# Patient Record
Sex: Male | Born: 1950
Health system: Southern US, Community
[De-identification: ages and names within clinical notes are randomized; demographics above are authoritative.]

## PROBLEM LIST (undated history)

## (undated) DIAGNOSIS — I10 Essential (primary) hypertension: Secondary | ICD-10-CM

## (undated) DIAGNOSIS — N200 Calculus of kidney: Secondary | ICD-10-CM

## (undated) DIAGNOSIS — J302 Other seasonal allergic rhinitis: Secondary | ICD-10-CM

## (undated) DIAGNOSIS — K219 Gastro-esophageal reflux disease without esophagitis: Secondary | ICD-10-CM

## (undated) DIAGNOSIS — R0609 Other forms of dyspnea: Secondary | ICD-10-CM

## (undated) DIAGNOSIS — R519 Headache, unspecified: Secondary | ICD-10-CM

## (undated) DIAGNOSIS — R0600 Dyspnea, unspecified: Secondary | ICD-10-CM

## (undated) DIAGNOSIS — M1990 Unspecified osteoarthritis, unspecified site: Secondary | ICD-10-CM

## (undated) DIAGNOSIS — G4733 Obstructive sleep apnea (adult) (pediatric): Secondary | ICD-10-CM

## (undated) DIAGNOSIS — K589 Irritable bowel syndrome without diarrhea: Secondary | ICD-10-CM

## (undated) HISTORY — DX: Other forms of dyspnea: R06.09

## (undated) HISTORY — DX: Other seasonal allergic rhinitis: J30.2

## (undated) HISTORY — PX: HAND SURGERY: SHX662

## (undated) HISTORY — DX: Essential (primary) hypertension: I10

## (undated) HISTORY — DX: Irritable bowel syndrome, unspecified: K58.9

## (undated) HISTORY — PX: NECK SURGERY: SHX720

## (undated) HISTORY — DX: Dyspnea, unspecified: R06.00

## (undated) HISTORY — DX: Unspecified osteoarthritis, unspecified site: M19.90

## (undated) HISTORY — PX: SHOULDER SURGERY: SHX246

## (undated) HISTORY — DX: Headache, unspecified: R51.9

## (undated) HISTORY — DX: Gastro-esophageal reflux disease without esophagitis: K21.9

## (undated) HISTORY — DX: Obstructive sleep apnea (adult) (pediatric): G47.33

## (undated) HISTORY — DX: Calculus of kidney: N20.0

---

## 2017-10-11 DIAGNOSIS — Z1331 Encounter for screening for depression: Secondary | ICD-10-CM | POA: Diagnosis not present

## 2017-10-11 DIAGNOSIS — Z6832 Body mass index (BMI) 32.0-32.9, adult: Secondary | ICD-10-CM | POA: Diagnosis not present

## 2017-10-11 DIAGNOSIS — Z9181 History of falling: Secondary | ICD-10-CM | POA: Diagnosis not present

## 2017-10-11 DIAGNOSIS — R2689 Other abnormalities of gait and mobility: Secondary | ICD-10-CM | POA: Diagnosis not present

## 2017-10-11 DIAGNOSIS — E559 Vitamin D deficiency, unspecified: Secondary | ICD-10-CM | POA: Diagnosis not present

## 2017-10-11 DIAGNOSIS — I10 Essential (primary) hypertension: Secondary | ICD-10-CM | POA: Diagnosis not present

## 2017-10-11 DIAGNOSIS — Z79899 Other long term (current) drug therapy: Secondary | ICD-10-CM | POA: Diagnosis not present

## 2017-10-11 DIAGNOSIS — F419 Anxiety disorder, unspecified: Secondary | ICD-10-CM | POA: Diagnosis not present

## 2017-12-08 DIAGNOSIS — Z1339 Encounter for screening examination for other mental health and behavioral disorders: Secondary | ICD-10-CM | POA: Diagnosis not present

## 2017-12-08 DIAGNOSIS — H8149 Vertigo of central origin, unspecified ear: Secondary | ICD-10-CM | POA: Diagnosis not present

## 2017-12-08 DIAGNOSIS — Z6833 Body mass index (BMI) 33.0-33.9, adult: Secondary | ICD-10-CM | POA: Diagnosis not present

## 2018-01-23 DIAGNOSIS — R42 Dizziness and giddiness: Secondary | ICD-10-CM | POA: Diagnosis not present

## 2018-01-23 DIAGNOSIS — G9389 Other specified disorders of brain: Secondary | ICD-10-CM | POA: Diagnosis not present

## 2018-01-30 DIAGNOSIS — Z0000 Encounter for general adult medical examination without abnormal findings: Secondary | ICD-10-CM | POA: Diagnosis not present

## 2018-01-30 DIAGNOSIS — I10 Essential (primary) hypertension: Secondary | ICD-10-CM | POA: Diagnosis not present

## 2018-01-30 DIAGNOSIS — K219 Gastro-esophageal reflux disease without esophagitis: Secondary | ICD-10-CM | POA: Diagnosis not present

## 2018-01-30 DIAGNOSIS — F419 Anxiety disorder, unspecified: Secondary | ICD-10-CM | POA: Diagnosis not present

## 2018-01-30 DIAGNOSIS — M2550 Pain in unspecified joint: Secondary | ICD-10-CM | POA: Diagnosis not present

## 2018-02-02 DIAGNOSIS — G9389 Other specified disorders of brain: Secondary | ICD-10-CM | POA: Diagnosis not present

## 2018-02-02 DIAGNOSIS — R4189 Other symptoms and signs involving cognitive functions and awareness: Secondary | ICD-10-CM | POA: Diagnosis not present

## 2018-02-07 DIAGNOSIS — E785 Hyperlipidemia, unspecified: Secondary | ICD-10-CM | POA: Diagnosis not present

## 2018-02-07 DIAGNOSIS — Z79899 Other long term (current) drug therapy: Secondary | ICD-10-CM | POA: Diagnosis not present

## 2018-02-10 DIAGNOSIS — Z9181 History of falling: Secondary | ICD-10-CM | POA: Diagnosis not present

## 2018-02-10 DIAGNOSIS — I2510 Atherosclerotic heart disease of native coronary artery without angina pectoris: Secondary | ICD-10-CM | POA: Diagnosis present

## 2018-02-10 DIAGNOSIS — K219 Gastro-esophageal reflux disease without esophagitis: Secondary | ICD-10-CM | POA: Diagnosis present

## 2018-02-10 DIAGNOSIS — Z981 Arthrodesis status: Secondary | ICD-10-CM | POA: Diagnosis not present

## 2018-02-10 DIAGNOSIS — G912 (Idiopathic) normal pressure hydrocephalus: Secondary | ICD-10-CM | POA: Diagnosis present

## 2018-02-10 DIAGNOSIS — Z881 Allergy status to other antibiotic agents status: Secondary | ICD-10-CM | POA: Diagnosis not present

## 2018-02-10 DIAGNOSIS — Z87442 Personal history of urinary calculi: Secondary | ICD-10-CM | POA: Diagnosis not present

## 2018-02-10 DIAGNOSIS — Z7982 Long term (current) use of aspirin: Secondary | ICD-10-CM | POA: Diagnosis not present

## 2018-02-10 DIAGNOSIS — F329 Major depressive disorder, single episode, unspecified: Secondary | ICD-10-CM | POA: Diagnosis present

## 2018-02-10 DIAGNOSIS — E669 Obesity, unspecified: Secondary | ICD-10-CM | POA: Diagnosis present

## 2018-02-10 DIAGNOSIS — I10 Essential (primary) hypertension: Secondary | ICD-10-CM | POA: Diagnosis present

## 2018-02-10 DIAGNOSIS — N400 Enlarged prostate without lower urinary tract symptoms: Secondary | ICD-10-CM | POA: Diagnosis present

## 2018-02-10 DIAGNOSIS — Z87820 Personal history of traumatic brain injury: Secondary | ICD-10-CM | POA: Diagnosis not present

## 2018-02-10 DIAGNOSIS — K589 Irritable bowel syndrome without diarrhea: Secondary | ICD-10-CM | POA: Diagnosis present

## 2018-02-10 DIAGNOSIS — Z882 Allergy status to sulfonamides status: Secondary | ICD-10-CM | POA: Diagnosis not present

## 2018-02-10 DIAGNOSIS — Z6833 Body mass index (BMI) 33.0-33.9, adult: Secondary | ICD-10-CM | POA: Diagnosis not present

## 2018-02-10 DIAGNOSIS — Z79899 Other long term (current) drug therapy: Secondary | ICD-10-CM | POA: Diagnosis not present

## 2018-02-12 DIAGNOSIS — G912 (Idiopathic) normal pressure hydrocephalus: Secondary | ICD-10-CM | POA: Insufficient documentation

## 2018-02-12 HISTORY — DX: (Idiopathic) normal pressure hydrocephalus: G91.2

## 2018-02-13 HISTORY — PX: VENTRICULOPERITONEAL SHUNT: SHX204

## 2018-02-26 DIAGNOSIS — F419 Anxiety disorder, unspecified: Secondary | ICD-10-CM | POA: Diagnosis not present

## 2018-02-26 DIAGNOSIS — J40 Bronchitis, not specified as acute or chronic: Secondary | ICD-10-CM | POA: Diagnosis not present

## 2018-02-26 DIAGNOSIS — I10 Essential (primary) hypertension: Secondary | ICD-10-CM | POA: Diagnosis not present

## 2018-02-26 DIAGNOSIS — J329 Chronic sinusitis, unspecified: Secondary | ICD-10-CM | POA: Diagnosis not present

## 2018-02-27 DIAGNOSIS — M6530 Trigger finger, unspecified finger: Secondary | ICD-10-CM | POA: Diagnosis not present

## 2018-02-27 DIAGNOSIS — G912 (Idiopathic) normal pressure hydrocephalus: Secondary | ICD-10-CM | POA: Diagnosis not present

## 2018-02-27 DIAGNOSIS — H43392 Other vitreous opacities, left eye: Secondary | ICD-10-CM | POA: Diagnosis not present

## 2018-02-27 DIAGNOSIS — Z982 Presence of cerebrospinal fluid drainage device: Secondary | ICD-10-CM | POA: Diagnosis not present

## 2018-03-07 DIAGNOSIS — Z6833 Body mass index (BMI) 33.0-33.9, adult: Secondary | ICD-10-CM | POA: Diagnosis not present

## 2018-03-07 DIAGNOSIS — J320 Chronic maxillary sinusitis: Secondary | ICD-10-CM | POA: Diagnosis not present

## 2018-03-08 DIAGNOSIS — I10 Essential (primary) hypertension: Secondary | ICD-10-CM | POA: Diagnosis not present

## 2018-03-08 DIAGNOSIS — H4302 Vitreous prolapse, left eye: Secondary | ICD-10-CM | POA: Diagnosis not present

## 2018-03-13 DIAGNOSIS — M65332 Trigger finger, left middle finger: Secondary | ICD-10-CM | POA: Diagnosis not present

## 2018-04-10 DIAGNOSIS — G912 (Idiopathic) normal pressure hydrocephalus: Secondary | ICD-10-CM | POA: Diagnosis not present

## 2018-04-10 DIAGNOSIS — Z982 Presence of cerebrospinal fluid drainage device: Secondary | ICD-10-CM | POA: Diagnosis not present

## 2018-04-18 DIAGNOSIS — L700 Acne vulgaris: Secondary | ICD-10-CM | POA: Diagnosis not present

## 2018-04-18 DIAGNOSIS — L820 Inflamed seborrheic keratosis: Secondary | ICD-10-CM | POA: Diagnosis not present

## 2018-05-01 DIAGNOSIS — Z6834 Body mass index (BMI) 34.0-34.9, adult: Secondary | ICD-10-CM | POA: Diagnosis not present

## 2018-05-01 DIAGNOSIS — I10 Essential (primary) hypertension: Secondary | ICD-10-CM | POA: Diagnosis not present

## 2018-05-01 DIAGNOSIS — M2550 Pain in unspecified joint: Secondary | ICD-10-CM | POA: Diagnosis not present

## 2018-05-18 DIAGNOSIS — G912 (Idiopathic) normal pressure hydrocephalus: Secondary | ICD-10-CM | POA: Diagnosis not present

## 2018-05-18 DIAGNOSIS — G44309 Post-traumatic headache, unspecified, not intractable: Secondary | ICD-10-CM | POA: Diagnosis not present

## 2018-05-18 DIAGNOSIS — Z982 Presence of cerebrospinal fluid drainage device: Secondary | ICD-10-CM | POA: Diagnosis not present

## 2018-06-07 DIAGNOSIS — N400 Enlarged prostate without lower urinary tract symptoms: Secondary | ICD-10-CM | POA: Diagnosis not present

## 2018-06-07 DIAGNOSIS — M2550 Pain in unspecified joint: Secondary | ICD-10-CM | POA: Diagnosis not present

## 2018-06-07 DIAGNOSIS — Z6833 Body mass index (BMI) 33.0-33.9, adult: Secondary | ICD-10-CM | POA: Diagnosis not present

## 2018-06-07 DIAGNOSIS — G4700 Insomnia, unspecified: Secondary | ICD-10-CM | POA: Diagnosis not present

## 2018-06-15 DIAGNOSIS — Z6833 Body mass index (BMI) 33.0-33.9, adult: Secondary | ICD-10-CM | POA: Diagnosis not present

## 2018-06-15 DIAGNOSIS — F419 Anxiety disorder, unspecified: Secondary | ICD-10-CM | POA: Diagnosis not present

## 2018-06-15 DIAGNOSIS — J019 Acute sinusitis, unspecified: Secondary | ICD-10-CM | POA: Diagnosis not present

## 2018-06-25 DIAGNOSIS — J0110 Acute frontal sinusitis, unspecified: Secondary | ICD-10-CM | POA: Diagnosis not present

## 2018-06-25 DIAGNOSIS — Z6832 Body mass index (BMI) 32.0-32.9, adult: Secondary | ICD-10-CM | POA: Diagnosis not present

## 2018-06-25 DIAGNOSIS — M2550 Pain in unspecified joint: Secondary | ICD-10-CM | POA: Diagnosis not present

## 2018-06-25 DIAGNOSIS — G4700 Insomnia, unspecified: Secondary | ICD-10-CM | POA: Diagnosis not present

## 2018-07-16 DIAGNOSIS — J029 Acute pharyngitis, unspecified: Secondary | ICD-10-CM | POA: Diagnosis not present

## 2018-07-16 DIAGNOSIS — Z6832 Body mass index (BMI) 32.0-32.9, adult: Secondary | ICD-10-CM | POA: Diagnosis not present

## 2018-07-31 DIAGNOSIS — K219 Gastro-esophageal reflux disease without esophagitis: Secondary | ICD-10-CM | POA: Diagnosis not present

## 2018-07-31 DIAGNOSIS — K589 Irritable bowel syndrome without diarrhea: Secondary | ICD-10-CM | POA: Diagnosis not present

## 2018-07-31 DIAGNOSIS — J029 Acute pharyngitis, unspecified: Secondary | ICD-10-CM | POA: Diagnosis not present

## 2018-07-31 DIAGNOSIS — B370 Candidal stomatitis: Secondary | ICD-10-CM | POA: Diagnosis not present

## 2018-08-14 DIAGNOSIS — J309 Allergic rhinitis, unspecified: Secondary | ICD-10-CM | POA: Diagnosis not present

## 2018-08-14 DIAGNOSIS — Z6833 Body mass index (BMI) 33.0-33.9, adult: Secondary | ICD-10-CM | POA: Diagnosis not present

## 2018-08-14 DIAGNOSIS — J029 Acute pharyngitis, unspecified: Secondary | ICD-10-CM | POA: Diagnosis not present

## 2018-08-14 DIAGNOSIS — R0982 Postnasal drip: Secondary | ICD-10-CM | POA: Diagnosis not present

## 2018-09-14 DIAGNOSIS — Z79899 Other long term (current) drug therapy: Secondary | ICD-10-CM | POA: Diagnosis not present

## 2018-09-14 DIAGNOSIS — K219 Gastro-esophageal reflux disease without esophagitis: Secondary | ICD-10-CM | POA: Diagnosis not present

## 2018-09-14 DIAGNOSIS — J029 Acute pharyngitis, unspecified: Secondary | ICD-10-CM | POA: Diagnosis not present

## 2018-09-14 DIAGNOSIS — E782 Mixed hyperlipidemia: Secondary | ICD-10-CM | POA: Diagnosis not present

## 2018-09-14 DIAGNOSIS — R0982 Postnasal drip: Secondary | ICD-10-CM | POA: Diagnosis not present

## 2018-09-14 DIAGNOSIS — J309 Allergic rhinitis, unspecified: Secondary | ICD-10-CM | POA: Diagnosis not present

## 2018-09-25 DIAGNOSIS — Z87898 Personal history of other specified conditions: Secondary | ICD-10-CM | POA: Diagnosis not present

## 2018-09-25 DIAGNOSIS — K117 Disturbances of salivary secretion: Secondary | ICD-10-CM | POA: Diagnosis not present

## 2018-09-25 DIAGNOSIS — J342 Deviated nasal septum: Secondary | ICD-10-CM | POA: Diagnosis not present

## 2018-09-27 DIAGNOSIS — N3000 Acute cystitis without hematuria: Secondary | ICD-10-CM | POA: Diagnosis not present

## 2018-09-27 DIAGNOSIS — N318 Other neuromuscular dysfunction of bladder: Secondary | ICD-10-CM | POA: Diagnosis not present

## 2018-09-27 DIAGNOSIS — N401 Enlarged prostate with lower urinary tract symptoms: Secondary | ICD-10-CM | POA: Diagnosis not present

## 2018-10-02 DIAGNOSIS — H5213 Myopia, bilateral: Secondary | ICD-10-CM | POA: Diagnosis not present

## 2018-10-02 DIAGNOSIS — H524 Presbyopia: Secondary | ICD-10-CM | POA: Diagnosis not present

## 2018-10-02 DIAGNOSIS — H25813 Combined forms of age-related cataract, bilateral: Secondary | ICD-10-CM | POA: Diagnosis not present

## 2018-10-02 DIAGNOSIS — I10 Essential (primary) hypertension: Secondary | ICD-10-CM | POA: Diagnosis not present

## 2018-10-02 DIAGNOSIS — H52223 Regular astigmatism, bilateral: Secondary | ICD-10-CM | POA: Diagnosis not present

## 2018-10-12 DIAGNOSIS — W57XXXA Bitten or stung by nonvenomous insect and other nonvenomous arthropods, initial encounter: Secondary | ICD-10-CM | POA: Diagnosis not present

## 2018-10-12 DIAGNOSIS — Z6832 Body mass index (BMI) 32.0-32.9, adult: Secondary | ICD-10-CM | POA: Diagnosis not present

## 2018-10-12 DIAGNOSIS — S70369A Insect bite (nonvenomous), unspecified thigh, initial encounter: Secondary | ICD-10-CM | POA: Diagnosis not present

## 2018-10-22 DIAGNOSIS — N3000 Acute cystitis without hematuria: Secondary | ICD-10-CM | POA: Diagnosis not present

## 2018-10-22 DIAGNOSIS — N401 Enlarged prostate with lower urinary tract symptoms: Secondary | ICD-10-CM | POA: Diagnosis not present

## 2018-10-29 DIAGNOSIS — W57XXXA Bitten or stung by nonvenomous insect and other nonvenomous arthropods, initial encounter: Secondary | ICD-10-CM | POA: Diagnosis not present

## 2018-10-29 DIAGNOSIS — N400 Enlarged prostate without lower urinary tract symptoms: Secondary | ICD-10-CM | POA: Diagnosis not present

## 2018-10-29 DIAGNOSIS — M25511 Pain in right shoulder: Secondary | ICD-10-CM | POA: Diagnosis not present

## 2018-10-29 DIAGNOSIS — S70369A Insect bite (nonvenomous), unspecified thigh, initial encounter: Secondary | ICD-10-CM | POA: Diagnosis not present

## 2018-10-29 DIAGNOSIS — R21 Rash and other nonspecific skin eruption: Secondary | ICD-10-CM | POA: Diagnosis not present

## 2018-11-16 DIAGNOSIS — Z982 Presence of cerebrospinal fluid drainage device: Secondary | ICD-10-CM | POA: Diagnosis not present

## 2018-11-16 DIAGNOSIS — Z4541 Encounter for adjustment and management of cerebrospinal fluid drainage device: Secondary | ICD-10-CM | POA: Diagnosis not present

## 2018-11-16 DIAGNOSIS — R2689 Other abnormalities of gait and mobility: Secondary | ICD-10-CM | POA: Diagnosis not present

## 2018-11-16 DIAGNOSIS — G912 (Idiopathic) normal pressure hydrocephalus: Secondary | ICD-10-CM | POA: Diagnosis not present

## 2018-11-29 DIAGNOSIS — Z6832 Body mass index (BMI) 32.0-32.9, adult: Secondary | ICD-10-CM | POA: Diagnosis not present

## 2018-11-29 DIAGNOSIS — L255 Unspecified contact dermatitis due to plants, except food: Secondary | ICD-10-CM | POA: Diagnosis not present

## 2018-12-01 DIAGNOSIS — L819 Disorder of pigmentation, unspecified: Secondary | ICD-10-CM | POA: Diagnosis not present

## 2018-12-01 DIAGNOSIS — L299 Pruritus, unspecified: Secondary | ICD-10-CM | POA: Diagnosis not present

## 2018-12-01 DIAGNOSIS — R21 Rash and other nonspecific skin eruption: Secondary | ICD-10-CM | POA: Diagnosis not present

## 2018-12-01 DIAGNOSIS — L959 Vasculitis limited to the skin, unspecified: Secondary | ICD-10-CM | POA: Diagnosis not present

## 2018-12-11 DIAGNOSIS — Z23 Encounter for immunization: Secondary | ICD-10-CM | POA: Diagnosis not present

## 2018-12-11 DIAGNOSIS — Z6831 Body mass index (BMI) 31.0-31.9, adult: Secondary | ICD-10-CM | POA: Diagnosis not present

## 2018-12-11 DIAGNOSIS — L255 Unspecified contact dermatitis due to plants, except food: Secondary | ICD-10-CM | POA: Diagnosis not present

## 2018-12-25 DIAGNOSIS — S30861A Insect bite (nonvenomous) of abdominal wall, initial encounter: Secondary | ICD-10-CM | POA: Diagnosis not present

## 2018-12-25 DIAGNOSIS — S6992XA Unspecified injury of left wrist, hand and finger(s), initial encounter: Secondary | ICD-10-CM | POA: Diagnosis not present

## 2018-12-25 DIAGNOSIS — L255 Unspecified contact dermatitis due to plants, except food: Secondary | ICD-10-CM | POA: Diagnosis not present

## 2018-12-25 DIAGNOSIS — L089 Local infection of the skin and subcutaneous tissue, unspecified: Secondary | ICD-10-CM | POA: Diagnosis not present

## 2019-01-08 DIAGNOSIS — Z1331 Encounter for screening for depression: Secondary | ICD-10-CM | POA: Diagnosis not present

## 2019-01-08 DIAGNOSIS — Z9181 History of falling: Secondary | ICD-10-CM | POA: Diagnosis not present

## 2019-01-08 DIAGNOSIS — L239 Allergic contact dermatitis, unspecified cause: Secondary | ICD-10-CM | POA: Diagnosis not present

## 2019-01-08 DIAGNOSIS — S61409A Unspecified open wound of unspecified hand, initial encounter: Secondary | ICD-10-CM | POA: Diagnosis not present

## 2019-01-22 DIAGNOSIS — L239 Allergic contact dermatitis, unspecified cause: Secondary | ICD-10-CM | POA: Diagnosis not present

## 2019-01-22 DIAGNOSIS — S61409A Unspecified open wound of unspecified hand, initial encounter: Secondary | ICD-10-CM | POA: Diagnosis not present

## 2019-01-22 DIAGNOSIS — Z6831 Body mass index (BMI) 31.0-31.9, adult: Secondary | ICD-10-CM | POA: Diagnosis not present

## 2019-01-28 DIAGNOSIS — N401 Enlarged prostate with lower urinary tract symptoms: Secondary | ICD-10-CM | POA: Diagnosis not present

## 2019-01-28 DIAGNOSIS — N318 Other neuromuscular dysfunction of bladder: Secondary | ICD-10-CM | POA: Diagnosis not present

## 2019-02-25 DIAGNOSIS — Z125 Encounter for screening for malignant neoplasm of prostate: Secondary | ICD-10-CM | POA: Diagnosis not present

## 2019-02-25 DIAGNOSIS — K219 Gastro-esophageal reflux disease without esophagitis: Secondary | ICD-10-CM | POA: Diagnosis not present

## 2019-02-25 DIAGNOSIS — L739 Follicular disorder, unspecified: Secondary | ICD-10-CM | POA: Diagnosis not present

## 2019-02-25 DIAGNOSIS — E782 Mixed hyperlipidemia: Secondary | ICD-10-CM | POA: Diagnosis not present

## 2019-02-25 DIAGNOSIS — Z0000 Encounter for general adult medical examination without abnormal findings: Secondary | ICD-10-CM | POA: Diagnosis not present

## 2019-02-25 DIAGNOSIS — Z79899 Other long term (current) drug therapy: Secondary | ICD-10-CM | POA: Diagnosis not present

## 2019-02-25 DIAGNOSIS — N400 Enlarged prostate without lower urinary tract symptoms: Secondary | ICD-10-CM | POA: Diagnosis not present

## 2019-02-25 DIAGNOSIS — I10 Essential (primary) hypertension: Secondary | ICD-10-CM | POA: Diagnosis not present

## 2019-03-07 DIAGNOSIS — C44622 Squamous cell carcinoma of skin of right upper limb, including shoulder: Secondary | ICD-10-CM | POA: Diagnosis not present

## 2019-03-07 DIAGNOSIS — L700 Acne vulgaris: Secondary | ICD-10-CM | POA: Diagnosis not present

## 2019-04-18 DIAGNOSIS — M25511 Pain in right shoulder: Secondary | ICD-10-CM | POA: Diagnosis not present

## 2019-04-18 DIAGNOSIS — Z6833 Body mass index (BMI) 33.0-33.9, adult: Secondary | ICD-10-CM | POA: Diagnosis not present

## 2019-04-22 DIAGNOSIS — N401 Enlarged prostate with lower urinary tract symptoms: Secondary | ICD-10-CM | POA: Diagnosis not present

## 2019-04-22 DIAGNOSIS — N318 Other neuromuscular dysfunction of bladder: Secondary | ICD-10-CM | POA: Diagnosis not present

## 2019-04-22 DIAGNOSIS — N3020 Other chronic cystitis without hematuria: Secondary | ICD-10-CM | POA: Diagnosis not present

## 2019-05-06 DIAGNOSIS — Z79899 Other long term (current) drug therapy: Secondary | ICD-10-CM | POA: Diagnosis not present

## 2019-05-06 DIAGNOSIS — E785 Hyperlipidemia, unspecified: Secondary | ICD-10-CM | POA: Diagnosis not present

## 2019-05-06 DIAGNOSIS — L700 Acne vulgaris: Secondary | ICD-10-CM | POA: Diagnosis not present

## 2019-06-05 DIAGNOSIS — L700 Acne vulgaris: Secondary | ICD-10-CM | POA: Diagnosis not present

## 2019-07-08 DIAGNOSIS — L700 Acne vulgaris: Secondary | ICD-10-CM | POA: Diagnosis not present

## 2019-07-29 DIAGNOSIS — R1013 Epigastric pain: Secondary | ICD-10-CM | POA: Diagnosis not present

## 2019-07-29 DIAGNOSIS — K5900 Constipation, unspecified: Secondary | ICD-10-CM | POA: Diagnosis not present

## 2019-07-29 DIAGNOSIS — Z6832 Body mass index (BMI) 32.0-32.9, adult: Secondary | ICD-10-CM | POA: Diagnosis not present

## 2019-07-29 DIAGNOSIS — K644 Residual hemorrhoidal skin tags: Secondary | ICD-10-CM | POA: Diagnosis not present

## 2019-08-07 DIAGNOSIS — L700 Acne vulgaris: Secondary | ICD-10-CM | POA: Diagnosis not present

## 2019-08-27 DIAGNOSIS — I10 Essential (primary) hypertension: Secondary | ICD-10-CM | POA: Diagnosis not present

## 2019-08-27 DIAGNOSIS — M25511 Pain in right shoulder: Secondary | ICD-10-CM | POA: Diagnosis not present

## 2019-08-27 DIAGNOSIS — M6530 Trigger finger, unspecified finger: Secondary | ICD-10-CM | POA: Diagnosis not present

## 2019-08-27 DIAGNOSIS — E669 Obesity, unspecified: Secondary | ICD-10-CM | POA: Diagnosis not present

## 2019-09-05 DIAGNOSIS — M65322 Trigger finger, left index finger: Secondary | ICD-10-CM | POA: Diagnosis not present

## 2019-09-05 DIAGNOSIS — L700 Acne vulgaris: Secondary | ICD-10-CM | POA: Diagnosis not present

## 2019-10-19 DIAGNOSIS — B354 Tinea corporis: Secondary | ICD-10-CM | POA: Diagnosis not present

## 2019-10-19 DIAGNOSIS — R21 Rash and other nonspecific skin eruption: Secondary | ICD-10-CM | POA: Diagnosis not present

## 2019-11-06 DIAGNOSIS — S63615A Unspecified sprain of left ring finger, initial encounter: Secondary | ICD-10-CM | POA: Diagnosis not present

## 2019-11-06 DIAGNOSIS — L821 Other seborrheic keratosis: Secondary | ICD-10-CM | POA: Diagnosis not present

## 2019-11-06 DIAGNOSIS — Z6832 Body mass index (BMI) 32.0-32.9, adult: Secondary | ICD-10-CM | POA: Diagnosis not present

## 2019-11-06 DIAGNOSIS — L300 Nummular dermatitis: Secondary | ICD-10-CM | POA: Diagnosis not present

## 2019-11-19 DIAGNOSIS — M65342 Trigger finger, left ring finger: Secondary | ICD-10-CM | POA: Diagnosis not present

## 2019-11-19 DIAGNOSIS — M65322 Trigger finger, left index finger: Secondary | ICD-10-CM | POA: Diagnosis not present

## 2019-11-19 DIAGNOSIS — Z982 Presence of cerebrospinal fluid drainage device: Secondary | ICD-10-CM | POA: Diagnosis not present

## 2019-11-19 DIAGNOSIS — Z79899 Other long term (current) drug therapy: Secondary | ICD-10-CM | POA: Diagnosis not present

## 2019-11-19 DIAGNOSIS — G912 (Idiopathic) normal pressure hydrocephalus: Secondary | ICD-10-CM | POA: Diagnosis not present

## 2019-12-24 DIAGNOSIS — M65322 Trigger finger, left index finger: Secondary | ICD-10-CM | POA: Diagnosis not present

## 2019-12-24 DIAGNOSIS — M65342 Trigger finger, left ring finger: Secondary | ICD-10-CM | POA: Diagnosis not present

## 2020-02-21 DIAGNOSIS — Z23 Encounter for immunization: Secondary | ICD-10-CM | POA: Diagnosis not present

## 2020-03-02 DIAGNOSIS — Z0000 Encounter for general adult medical examination without abnormal findings: Secondary | ICD-10-CM | POA: Diagnosis not present

## 2020-03-02 DIAGNOSIS — Z23 Encounter for immunization: Secondary | ICD-10-CM | POA: Diagnosis not present

## 2020-03-02 DIAGNOSIS — E782 Mixed hyperlipidemia: Secondary | ICD-10-CM | POA: Diagnosis not present

## 2020-03-02 DIAGNOSIS — Z131 Encounter for screening for diabetes mellitus: Secondary | ICD-10-CM | POA: Diagnosis not present

## 2020-03-02 DIAGNOSIS — Z125 Encounter for screening for malignant neoplasm of prostate: Secondary | ICD-10-CM | POA: Diagnosis not present

## 2020-03-02 DIAGNOSIS — Z79899 Other long term (current) drug therapy: Secondary | ICD-10-CM | POA: Diagnosis not present

## 2020-03-02 DIAGNOSIS — Z1331 Encounter for screening for depression: Secondary | ICD-10-CM | POA: Diagnosis not present

## 2020-03-03 DIAGNOSIS — M1712 Unilateral primary osteoarthritis, left knee: Secondary | ICD-10-CM | POA: Diagnosis not present

## 2020-03-03 DIAGNOSIS — Z6833 Body mass index (BMI) 33.0-33.9, adult: Secondary | ICD-10-CM | POA: Diagnosis not present

## 2020-04-22 DIAGNOSIS — B9689 Other specified bacterial agents as the cause of diseases classified elsewhere: Secondary | ICD-10-CM | POA: Diagnosis not present

## 2020-04-22 DIAGNOSIS — M1712 Unilateral primary osteoarthritis, left knee: Secondary | ICD-10-CM | POA: Diagnosis not present

## 2020-04-22 DIAGNOSIS — J019 Acute sinusitis, unspecified: Secondary | ICD-10-CM | POA: Diagnosis not present

## 2020-05-19 DIAGNOSIS — M222X2 Patellofemoral disorders, left knee: Secondary | ICD-10-CM | POA: Diagnosis not present

## 2020-05-26 DIAGNOSIS — M65322 Trigger finger, left index finger: Secondary | ICD-10-CM | POA: Diagnosis not present

## 2020-05-26 DIAGNOSIS — M65342 Trigger finger, left ring finger: Secondary | ICD-10-CM | POA: Diagnosis not present

## 2020-06-01 DIAGNOSIS — M1712 Unilateral primary osteoarthritis, left knee: Secondary | ICD-10-CM | POA: Diagnosis not present

## 2020-06-01 DIAGNOSIS — Z6833 Body mass index (BMI) 33.0-33.9, adult: Secondary | ICD-10-CM | POA: Diagnosis not present

## 2020-06-01 DIAGNOSIS — I10 Essential (primary) hypertension: Secondary | ICD-10-CM | POA: Diagnosis not present

## 2020-06-01 DIAGNOSIS — R400 Somnolence: Secondary | ICD-10-CM | POA: Diagnosis not present

## 2020-06-12 DIAGNOSIS — M65322 Trigger finger, left index finger: Secondary | ICD-10-CM | POA: Diagnosis not present

## 2020-06-12 DIAGNOSIS — M65342 Trigger finger, left ring finger: Secondary | ICD-10-CM | POA: Diagnosis not present

## 2020-06-16 DIAGNOSIS — J301 Allergic rhinitis due to pollen: Secondary | ICD-10-CM | POA: Diagnosis not present

## 2020-06-16 DIAGNOSIS — R053 Chronic cough: Secondary | ICD-10-CM | POA: Diagnosis not present

## 2020-06-16 DIAGNOSIS — G4733 Obstructive sleep apnea (adult) (pediatric): Secondary | ICD-10-CM | POA: Diagnosis not present

## 2020-06-16 DIAGNOSIS — R5383 Other fatigue: Secondary | ICD-10-CM | POA: Diagnosis not present

## 2020-06-16 DIAGNOSIS — M6281 Muscle weakness (generalized): Secondary | ICD-10-CM | POA: Diagnosis not present

## 2020-06-16 DIAGNOSIS — M25562 Pain in left knee: Secondary | ICD-10-CM | POA: Diagnosis not present

## 2020-06-26 DIAGNOSIS — M25562 Pain in left knee: Secondary | ICD-10-CM | POA: Diagnosis not present

## 2020-06-26 DIAGNOSIS — M6281 Muscle weakness (generalized): Secondary | ICD-10-CM | POA: Diagnosis not present

## 2020-06-29 DIAGNOSIS — I10 Essential (primary) hypertension: Secondary | ICD-10-CM | POA: Diagnosis not present

## 2020-06-29 DIAGNOSIS — R400 Somnolence: Secondary | ICD-10-CM | POA: Diagnosis not present

## 2020-06-29 DIAGNOSIS — K219 Gastro-esophageal reflux disease without esophagitis: Secondary | ICD-10-CM | POA: Diagnosis not present

## 2020-06-29 DIAGNOSIS — G4700 Insomnia, unspecified: Secondary | ICD-10-CM | POA: Diagnosis not present

## 2020-06-30 DIAGNOSIS — M6281 Muscle weakness (generalized): Secondary | ICD-10-CM | POA: Diagnosis not present

## 2020-06-30 DIAGNOSIS — M25562 Pain in left knee: Secondary | ICD-10-CM | POA: Diagnosis not present

## 2020-07-06 DIAGNOSIS — G4733 Obstructive sleep apnea (adult) (pediatric): Secondary | ICD-10-CM | POA: Diagnosis not present

## 2020-07-06 DIAGNOSIS — Z711 Person with feared health complaint in whom no diagnosis is made: Secondary | ICD-10-CM | POA: Diagnosis not present

## 2020-07-06 DIAGNOSIS — R0683 Snoring: Secondary | ICD-10-CM | POA: Diagnosis not present

## 2020-07-07 DIAGNOSIS — M25562 Pain in left knee: Secondary | ICD-10-CM | POA: Diagnosis not present

## 2020-07-07 DIAGNOSIS — M6281 Muscle weakness (generalized): Secondary | ICD-10-CM | POA: Diagnosis not present

## 2020-07-10 DIAGNOSIS — R053 Chronic cough: Secondary | ICD-10-CM | POA: Diagnosis not present

## 2020-07-10 DIAGNOSIS — G4733 Obstructive sleep apnea (adult) (pediatric): Secondary | ICD-10-CM | POA: Diagnosis not present

## 2020-07-10 DIAGNOSIS — R5383 Other fatigue: Secondary | ICD-10-CM | POA: Diagnosis not present

## 2020-07-10 DIAGNOSIS — J301 Allergic rhinitis due to pollen: Secondary | ICD-10-CM | POA: Diagnosis not present

## 2020-07-16 DIAGNOSIS — M6281 Muscle weakness (generalized): Secondary | ICD-10-CM | POA: Diagnosis not present

## 2020-07-16 DIAGNOSIS — M25562 Pain in left knee: Secondary | ICD-10-CM | POA: Diagnosis not present

## 2020-07-24 DIAGNOSIS — M6281 Muscle weakness (generalized): Secondary | ICD-10-CM | POA: Diagnosis not present

## 2020-07-24 DIAGNOSIS — M25562 Pain in left knee: Secondary | ICD-10-CM | POA: Diagnosis not present

## 2020-07-31 DIAGNOSIS — M6281 Muscle weakness (generalized): Secondary | ICD-10-CM | POA: Diagnosis not present

## 2020-07-31 DIAGNOSIS — M25562 Pain in left knee: Secondary | ICD-10-CM | POA: Diagnosis not present

## 2020-08-04 DIAGNOSIS — M6281 Muscle weakness (generalized): Secondary | ICD-10-CM | POA: Diagnosis not present

## 2020-08-04 DIAGNOSIS — M25562 Pain in left knee: Secondary | ICD-10-CM | POA: Diagnosis not present

## 2020-08-12 DIAGNOSIS — I10 Essential (primary) hypertension: Secondary | ICD-10-CM | POA: Diagnosis not present

## 2020-08-12 DIAGNOSIS — M6281 Muscle weakness (generalized): Secondary | ICD-10-CM | POA: Diagnosis not present

## 2020-08-12 DIAGNOSIS — J302 Other seasonal allergic rhinitis: Secondary | ICD-10-CM | POA: Diagnosis not present

## 2020-08-12 DIAGNOSIS — F419 Anxiety disorder, unspecified: Secondary | ICD-10-CM | POA: Diagnosis not present

## 2020-08-12 DIAGNOSIS — M25562 Pain in left knee: Secondary | ICD-10-CM | POA: Diagnosis not present

## 2020-08-12 DIAGNOSIS — M1712 Unilateral primary osteoarthritis, left knee: Secondary | ICD-10-CM | POA: Diagnosis not present

## 2020-08-13 ENCOUNTER — Other Ambulatory Visit: Payer: Self-pay | Admitting: Orthopedic Surgery

## 2020-08-13 DIAGNOSIS — S83242A Other tear of medial meniscus, current injury, left knee, initial encounter: Secondary | ICD-10-CM | POA: Diagnosis not present

## 2020-08-13 DIAGNOSIS — M25562 Pain in left knee: Secondary | ICD-10-CM | POA: Diagnosis not present

## 2020-08-29 ENCOUNTER — Ambulatory Visit
Admission: RE | Admit: 2020-08-29 | Discharge: 2020-08-29 | Disposition: A | Discharge status: Home / Self Care | Payer: Medicare Other | Source: Ambulatory Visit | Attending: Orthopedic Surgery | Admitting: Orthopedic Surgery

## 2020-08-29 ENCOUNTER — Other Ambulatory Visit: Payer: Self-pay

## 2020-08-29 DIAGNOSIS — M25562 Pain in left knee: Secondary | ICD-10-CM | POA: Diagnosis not present

## 2020-09-01 DIAGNOSIS — R519 Headache, unspecified: Secondary | ICD-10-CM | POA: Diagnosis not present

## 2020-09-01 DIAGNOSIS — Z4541 Encounter for adjustment and management of cerebrospinal fluid drainage device: Secondary | ICD-10-CM | POA: Diagnosis not present

## 2020-09-01 DIAGNOSIS — Z982 Presence of cerebrospinal fluid drainage device: Secondary | ICD-10-CM | POA: Diagnosis not present

## 2020-09-01 DIAGNOSIS — G8929 Other chronic pain: Secondary | ICD-10-CM | POA: Diagnosis not present

## 2020-09-02 DIAGNOSIS — M25562 Pain in left knee: Secondary | ICD-10-CM | POA: Diagnosis not present

## 2020-09-10 DIAGNOSIS — J302 Other seasonal allergic rhinitis: Secondary | ICD-10-CM | POA: Diagnosis not present

## 2020-09-10 DIAGNOSIS — R051 Acute cough: Secondary | ICD-10-CM | POA: Diagnosis not present

## 2020-09-10 DIAGNOSIS — Z20828 Contact with and (suspected) exposure to other viral communicable diseases: Secondary | ICD-10-CM | POA: Diagnosis not present

## 2020-09-10 DIAGNOSIS — Z6833 Body mass index (BMI) 33.0-33.9, adult: Secondary | ICD-10-CM | POA: Diagnosis not present

## 2020-09-11 DIAGNOSIS — S83242A Other tear of medial meniscus, current injury, left knee, initial encounter: Secondary | ICD-10-CM | POA: Diagnosis not present

## 2020-09-11 DIAGNOSIS — X58XXXA Exposure to other specified factors, initial encounter: Secondary | ICD-10-CM | POA: Diagnosis not present

## 2020-09-11 DIAGNOSIS — M23222 Derangement of posterior horn of medial meniscus due to old tear or injury, left knee: Secondary | ICD-10-CM | POA: Diagnosis not present

## 2020-09-11 DIAGNOSIS — G8918 Other acute postprocedural pain: Secondary | ICD-10-CM | POA: Diagnosis not present

## 2020-09-11 DIAGNOSIS — Y999 Unspecified external cause status: Secondary | ICD-10-CM | POA: Diagnosis not present

## 2020-09-18 DIAGNOSIS — J302 Other seasonal allergic rhinitis: Secondary | ICD-10-CM | POA: Diagnosis not present

## 2020-09-18 DIAGNOSIS — Z6834 Body mass index (BMI) 34.0-34.9, adult: Secondary | ICD-10-CM | POA: Diagnosis not present

## 2020-09-18 DIAGNOSIS — J329 Chronic sinusitis, unspecified: Secondary | ICD-10-CM | POA: Diagnosis not present

## 2020-09-18 DIAGNOSIS — J40 Bronchitis, not specified as acute or chronic: Secondary | ICD-10-CM | POA: Diagnosis not present

## 2020-09-21 DIAGNOSIS — Z6834 Body mass index (BMI) 34.0-34.9, adult: Secondary | ICD-10-CM | POA: Diagnosis not present

## 2020-09-21 DIAGNOSIS — J329 Chronic sinusitis, unspecified: Secondary | ICD-10-CM | POA: Diagnosis not present

## 2020-09-21 DIAGNOSIS — J40 Bronchitis, not specified as acute or chronic: Secondary | ICD-10-CM | POA: Diagnosis not present

## 2020-09-22 DIAGNOSIS — M25562 Pain in left knee: Secondary | ICD-10-CM | POA: Diagnosis not present

## 2020-09-22 DIAGNOSIS — R531 Weakness: Secondary | ICD-10-CM | POA: Diagnosis not present

## 2020-09-24 DIAGNOSIS — M25562 Pain in left knee: Secondary | ICD-10-CM | POA: Diagnosis not present

## 2020-09-24 DIAGNOSIS — R531 Weakness: Secondary | ICD-10-CM | POA: Diagnosis not present

## 2020-09-30 DIAGNOSIS — M25562 Pain in left knee: Secondary | ICD-10-CM | POA: Diagnosis not present

## 2020-10-06 DIAGNOSIS — N400 Enlarged prostate without lower urinary tract symptoms: Secondary | ICD-10-CM | POA: Diagnosis not present

## 2020-10-06 DIAGNOSIS — J329 Chronic sinusitis, unspecified: Secondary | ICD-10-CM | POA: Diagnosis not present

## 2020-10-06 DIAGNOSIS — Z6834 Body mass index (BMI) 34.0-34.9, adult: Secondary | ICD-10-CM | POA: Diagnosis not present

## 2020-10-06 DIAGNOSIS — J40 Bronchitis, not specified as acute or chronic: Secondary | ICD-10-CM | POA: Diagnosis not present

## 2020-10-07 DIAGNOSIS — M25562 Pain in left knee: Secondary | ICD-10-CM | POA: Diagnosis not present

## 2020-10-14 DIAGNOSIS — M25562 Pain in left knee: Secondary | ICD-10-CM | POA: Diagnosis not present

## 2020-10-21 DIAGNOSIS — M25562 Pain in left knee: Secondary | ICD-10-CM | POA: Diagnosis not present

## 2020-11-12 DIAGNOSIS — I10 Essential (primary) hypertension: Secondary | ICD-10-CM | POA: Diagnosis not present

## 2020-11-12 DIAGNOSIS — R0609 Other forms of dyspnea: Secondary | ICD-10-CM | POA: Diagnosis not present

## 2020-11-12 DIAGNOSIS — J302 Other seasonal allergic rhinitis: Secondary | ICD-10-CM | POA: Diagnosis not present

## 2020-11-12 DIAGNOSIS — U099 Post covid-19 condition, unspecified: Secondary | ICD-10-CM | POA: Diagnosis not present

## 2020-11-18 ENCOUNTER — Encounter: Payer: Self-pay | Admitting: Cardiology

## 2020-11-18 ENCOUNTER — Encounter: Payer: Self-pay | Admitting: *Deleted

## 2020-11-26 DIAGNOSIS — U099 Post covid-19 condition, unspecified: Secondary | ICD-10-CM | POA: Diagnosis not present

## 2020-11-26 DIAGNOSIS — R0609 Other forms of dyspnea: Secondary | ICD-10-CM | POA: Diagnosis not present

## 2020-11-26 DIAGNOSIS — Z6834 Body mass index (BMI) 34.0-34.9, adult: Secondary | ICD-10-CM | POA: Diagnosis not present

## 2020-11-26 DIAGNOSIS — M25511 Pain in right shoulder: Secondary | ICD-10-CM | POA: Diagnosis not present

## 2020-12-01 ENCOUNTER — Other Ambulatory Visit: Payer: Self-pay

## 2020-12-01 ENCOUNTER — Ambulatory Visit (INDEPENDENT_AMBULATORY_CARE_PROVIDER_SITE_OTHER): Payer: Medicare Other | Admitting: Cardiology

## 2020-12-01 ENCOUNTER — Encounter: Payer: Self-pay | Admitting: Cardiology

## 2020-12-01 VITALS — BP 138/98 | HR 85 | Ht 67.0 in | Wt 216.6 lb

## 2020-12-01 DIAGNOSIS — R0789 Other chest pain: Secondary | ICD-10-CM | POA: Diagnosis not present

## 2020-12-01 DIAGNOSIS — I10 Essential (primary) hypertension: Secondary | ICD-10-CM | POA: Insufficient documentation

## 2020-12-01 DIAGNOSIS — R0609 Other forms of dyspnea: Secondary | ICD-10-CM | POA: Insufficient documentation

## 2020-12-01 DIAGNOSIS — R079 Chest pain, unspecified: Secondary | ICD-10-CM

## 2020-12-01 DIAGNOSIS — R0600 Dyspnea, unspecified: Secondary | ICD-10-CM | POA: Diagnosis not present

## 2020-12-01 DIAGNOSIS — E785 Hyperlipidemia, unspecified: Secondary | ICD-10-CM | POA: Diagnosis not present

## 2020-12-01 DIAGNOSIS — G912 (Idiopathic) normal pressure hydrocephalus: Secondary | ICD-10-CM

## 2020-12-01 NOTE — Progress Notes (Signed)
Cardiology Consultation:    Date:  12/01/2020   ID:  Ricky Perry, DOB November 22, 1950, MRN [id number]  PCP:  Ricky Grammes, MD  Cardiologist:  [name], MD   Referring MD: Ricky Grammes, MD   Chief Complaint  Patient presents with   Shortness of Breath    History of Present Illness:    Ricky Perry is a 70 y.o. male who is being seen today for the evaluation of shortness of breath at the request of Ricky Grammes, MD. he relocated from [place] 3 years ago apparently was find to have some normal pressure hydrocephalus and required surgery he learned at best like to have it done was at [facility] that is why he decided to relocate to this place.  Recently couple months ago he started experiencing exertional shortness of breath.  Apparently few months ago he got COVID-19 infection x-ray revealed some scar tissue after COVID however shortness of breath was present even before COVID-19 infection.  He said he got some place to walk at home which is about 50 feet and he got really rough time doing it because of shortness of breath.  He denies have any tightness squeezing pressure burning chest when he walks but states he described to have some uneasy sensation in the chest which is not related to exercise.  That sensation usually last for few seconds. He does have essential hypertension, I do have his cholesterol which is quite reasonable with LDL of 91 and HDL of 30. He never smoke Does not have family history of premature cardiac death or coronary artery disease.  Past Medical History:  Diagnosis Date   DOE (dyspnea on exertion)    Essential hypertension    Mild obstructive sleep apnea    NPH (normal pressure hydrocephalus) ([place]) 02/12/2018   Seasonal allergies     Past Surgical History:  Procedure Laterality Date   HAND SURGERY     NECK SURGERY     SHOULDER SURGERY Bilateral    VENTRICULOPERITONEAL SHUNT  02/13/2018   By Meade Maw at [facility]    Current Medications: Current Meds  Medication Sig   amLODipine (NORVASC) 2.5 MG tablet Take 2.5 mg by mouth daily.   amoxicillin (AMOXIL) 500 MG capsule Take 2,000 mg by mouth once. 1 hour prior to dental appointment   Azelastine HCl 137 MCG/SPRAY SOLN Place 2 sprays into both nostrils 2 (two) times daily.   BREZTRI AEROSPHERE 160-9-4.8 MCG/ACT AERO Inhale 180 mcg into the lungs daily.   citalopram (CELEXA) 20 MG tablet Take 20 mg by mouth daily.   gabapentin (NEURONTIN) 400 MG capsule Take 400 mg by mouth 2 (two) times daily.   levocetirizine (XYZAL) 5 MG tablet Take 5 mg by mouth at bedtime.   montelukast (SINGULAIR) 10 MG tablet Take 10 mg by mouth daily.   naproxen (NAPROSYN) 500 MG tablet Take 500 mg by mouth 2 (two) times daily as needed for pain.   Nutritional Supplements (KETO PO) Take 1 tablet by mouth daily. Unknown strength   omeprazole (PRILOSEC) 40 MG capsule Take 40 mg by mouth daily.   tamsulosin (FLOMAX) 0.4 MG CAPS capsule Take 0.4 mg by mouth daily.   traZODone (DESYREL) 100 MG tablet Take 100 mg by mouth at bedtime.     Allergies:   Sulfamethoxazole-trimethoprim, Cephalosporins, Oxycodone-acetaminophen, and Tape   Social History   Socioeconomic History   Marital status: Married    Spouse name: Not on file   Number of children: Not on  file   Years of education: Not on file   Highest education level: Not on file  Occupational History   Not on file  Tobacco Use   Smoking status: Never   Smokeless tobacco: Never  Substance and Sexual Activity   Alcohol use: Never   Drug use: Never   Sexual activity: Yes  Other Topics Concern   Not on file  Social History Narrative   Not on file   Social Determinants of Health   Financial Resource Strain: Not on file  Food Insecurity: Not on file  Transportation Needs: Not on file  Physical Activity: Not on file  Stress: Not on file  Social Connections: Not on file     Family History: The patient's  family history includes Hypertension in his mother; Lung cancer in his father. ROS:   Please see the history of present illness.    All 14 point review of systems negative except as described per history of present illness.  EKGs/Labs/Other Studies Reviewed:    The following studies were reviewed today:   EKG:  EKG is  ordered today.  The ekg ordered today demonstrates normal sinus rhythm, minimal voltage criteria for LVH, nonspecific ST segment changes  Recent Labs: No results found for requested labs within last 8760 hours.  Recent Lipid Panel No results found for: CHOL, TRIG, HDL, CHOLHDL, VLDL, LDLCALC, LDLDIRECT  Physical Exam:    VS:  BP (!) 138/98 (BP Location: Left Arm, Patient Position: Sitting)   Pulse 85   Ht '5\' 7"'$  (1.702 m)   Wt 216 lb 9.6 oz (98.2 kg)   SpO2 95%   BMI 33.92 kg/m     Wt Readings from Last 3 Encounters:  12/01/20 216 lb 9.6 oz (98.2 kg)  11/12/20 221 lb (100.2 kg)     GEN:  Well nourished, well developed in no acute distress HEENT: Normal NECK: No JVD; No carotid bruits LYMPHATICS: No lymphadenopathy CARDIAC: RRR, no murmurs, no rubs, no gallops RESPIRATORY:  Clear to auscultation without rales, wheezing or rhonchi  ABDOMEN: Soft, non-tender, non-distended MUSCULOSKELETAL:  No edema; No deformity  SKIN: Warm and dry NEUROLOGIC:  Alert and oriented x 3 PSYCHIATRIC:  Normal affect   ASSESSMENT:    1. NPH (normal pressure hydrocephalus) (HCC)   2. Dyspnea on exertion   3. Atypical chest pain   4. Dyslipidemia   5. Essential hypertension    PLAN:    In order of problems listed above:  Normal pressure hydrocephalus that being followed by team from [place] apparently stable. Dyspnea on exertion convinced this is multifactorial.  He is being aggressively investigated by pulmonary team plan to find out if a problem is pulmonary.  I will do my part of this investigation which should include echocardiogram to assess left ventricle ejection  fraction as well as systolic and diastolic function, he also will be scheduled to have Lexiscan to make sure he does not have inducible ischemia or angina equivalent. Dyslipidemia his cholesterol is reasonable.  If stress test will be normal we will continue present management. Essential hypertension blood pressure seems to be well controlled. We did talk about healthy lifestyle   Medication Adjustments/Labs and Tests Ordered: Current medicines are reviewed at length with the patient today.  Concerns regarding medicines are outlined above.  No orders of the defined types were placed in this encounter.  No orders of the defined types were placed in this encounter.   Signed, Park Liter, MD, [facility]. 12/01/2020 2:15 PM  [facility]

## 2020-12-01 NOTE — Patient Instructions (Signed)
Medication Instructions:  Your physician recommends that you continue on your current medications as directed. Please refer to the Current Medication list given to you today.  *If you need a refill on your cardiac medications before your next appointment, please call your pharmacy*   Lab Work: None If you have labs (blood work) drawn today and your tests are completely normal, you will receive your results only by: [place] (if you have MyChart) OR A paper copy in the mail If you have any lab test that is abnormal or we need to change your treatment, we will call you to review the results.   Testing/Procedures: Your physician has requested that you have a lexiscan myoview. For further information please visit [email or web address]. Please follow instruction sheet, as given.   Your physician has requested that you have an echocardiogram. Echocardiography is a painless test that uses sound waves to create images of your heart. It provides your doctor with information about the size and shape of your heart and how well your heart's chambers and valves are working. This procedure takes approximately one hour. There are no restrictions for this procedure.    Follow-Up: At [facility], you and your health needs are our priority.  As part of our continuing mission to provide you with exceptional heart care, we have created designated Provider Care Teams.  These Care Teams include your primary Cardiologist (physician) and Advanced Practice Providers (APPs -  Physician Assistants and Nurse Practitioners) who all work together to provide you with the care you need, when you need it.  We recommend signing up for the patient portal called "MyChart".  Sign up information is provided on this After Visit Summary.  MyChart is used to connect with patients for Virtual Visits (Telemedicine).  Patients are able to view lab/test results, encounter notes, upcoming appointments, etc.  Non-urgent messages  can be sent to your provider as well.   To learn more about what you can do with MyChart, go to [email or web address].    Your next appointment:   3 month(s)  The format for your next appointment:   In Person  Provider:   [name], MD   Other Instructions

## 2020-12-02 DIAGNOSIS — R0789 Other chest pain: Secondary | ICD-10-CM | POA: Diagnosis not present

## 2020-12-02 DIAGNOSIS — R0602 Shortness of breath: Secondary | ICD-10-CM | POA: Diagnosis not present

## 2020-12-02 DIAGNOSIS — R0609 Other forms of dyspnea: Secondary | ICD-10-CM | POA: Diagnosis not present

## 2020-12-08 NOTE — Addendum Note (Signed)
Addended by: Truddie Hidden on: 12/08/2020 12:32 PM   Modules accepted: Orders

## 2020-12-09 ENCOUNTER — Telehealth (HOSPITAL_COMMUNITY): Payer: Self-pay | Admitting: *Deleted

## 2020-12-09 NOTE — Telephone Encounter (Signed)
Patient given detailed instructions per Myocardial Perfusion Study Information Sheet for the test on 12/16/20 at 7:45. Patient notified to arrive 15 minutes early and that it is imperative to arrive on time for appointment to keep from having the test rescheduled.  If you need to cancel or reschedule your appointment, please call the office within 24 hours of your appointment. . Patient verbalized understanding.Ricky Perry

## 2020-12-10 ENCOUNTER — Other Ambulatory Visit: Payer: Self-pay

## 2020-12-10 NOTE — Addendum Note (Signed)
Addended by: [name] on: 12/10/2020 01:29 PM   Modules accepted: Orders

## 2020-12-16 ENCOUNTER — Ambulatory Visit (INDEPENDENT_AMBULATORY_CARE_PROVIDER_SITE_OTHER): Payer: Medicare Other

## 2020-12-16 ENCOUNTER — Other Ambulatory Visit: Payer: Self-pay

## 2020-12-16 DIAGNOSIS — R079 Chest pain, unspecified: Secondary | ICD-10-CM | POA: Diagnosis not present

## 2020-12-16 DIAGNOSIS — G912 (Idiopathic) normal pressure hydrocephalus: Secondary | ICD-10-CM

## 2020-12-16 DIAGNOSIS — E785 Hyperlipidemia, unspecified: Secondary | ICD-10-CM

## 2020-12-16 DIAGNOSIS — R0600 Dyspnea, unspecified: Secondary | ICD-10-CM

## 2020-12-16 DIAGNOSIS — I10 Essential (primary) hypertension: Secondary | ICD-10-CM | POA: Diagnosis not present

## 2020-12-16 DIAGNOSIS — R0609 Other forms of dyspnea: Secondary | ICD-10-CM

## 2020-12-16 DIAGNOSIS — R0789 Other chest pain: Secondary | ICD-10-CM

## 2020-12-16 LAB — MYOCARDIAL PERFUSION IMAGING
LV dias vol: 39 mL (ref 62–150)
LV sys vol: 82 mL
Nuc Stress EF: 53 %
Peak HR: 91 {beats}/min
Rest HR: 65 {beats}/min
Rest Nuclear Isotope Dose: 10.9 mCi
SDS: 4
SRS: 2
SSS: 6
Stress Nuclear Isotope Dose: 29.8 mCi
TID: 1.08

## 2020-12-16 MED ORDER — PERFLUTREN LIPID MICROSPHERE
1.0000 mL | INTRAVENOUS | Status: AC | PRN
  Administered 2020-12-16: 5 mL via INTRAVENOUS

## 2020-12-16 MED ORDER — REGADENOSON 0.4 MG/5ML IV SOLN
0.4000 mg | Freq: Once | INTRAVENOUS | Status: AC
  Administered 2020-12-16: 0.4 mg via INTRAVENOUS

## 2020-12-16 MED ORDER — TECHNETIUM TC 99M TETROFOSMIN IV KIT
10.9000 | PACK | Freq: Once | INTRAVENOUS | Status: AC | PRN
  Administered 2020-12-16: 10.9 via INTRAVENOUS

## 2020-12-16 MED ORDER — TECHNETIUM TC 99M TETROFOSMIN IV KIT
29.8000 | PACK | Freq: Once | INTRAVENOUS | Status: AC | PRN
  Administered 2020-12-16: 29.8 via INTRAVENOUS

## 2020-12-16 NOTE — Progress Notes (Addendum)
Complete echocardiogram with performed. IV in in right hand from nuclear stress test was used and left in place after exam was ended.  Jimmy Daleigh Pollinger RDCS, RVT

## 2020-12-17 LAB — ECHOCARDIOGRAM COMPLETE
Area-P 1/2: 5.2 cm2
Height: 67 in
S' Lateral: 3.1 cm
Weight: 3456 oz

## 2020-12-18 ENCOUNTER — Telehealth: Payer: Self-pay | Admitting: Cardiology

## 2020-12-18 DIAGNOSIS — Z79899 Other long term (current) drug therapy: Secondary | ICD-10-CM | POA: Diagnosis not present

## 2020-12-18 DIAGNOSIS — U099 Post covid-19 condition, unspecified: Secondary | ICD-10-CM | POA: Diagnosis not present

## 2020-12-18 DIAGNOSIS — R0609 Other forms of dyspnea: Secondary | ICD-10-CM | POA: Diagnosis not present

## 2020-12-18 DIAGNOSIS — R079 Chest pain, unspecified: Secondary | ICD-10-CM | POA: Diagnosis not present

## 2020-12-18 DIAGNOSIS — Z0000 Encounter for general adult medical examination without abnormal findings: Secondary | ICD-10-CM | POA: Diagnosis not present

## 2020-12-18 DIAGNOSIS — G8929 Other chronic pain: Secondary | ICD-10-CM | POA: Diagnosis not present

## 2020-12-18 DIAGNOSIS — E782 Mixed hyperlipidemia: Secondary | ICD-10-CM | POA: Diagnosis not present

## 2020-12-18 NOTE — Telephone Encounter (Signed)
Spoke with patient, see chart.    

## 2020-12-18 NOTE — Telephone Encounter (Signed)
Pt is returning call.  

## 2020-12-24 DIAGNOSIS — R0981 Nasal congestion: Secondary | ICD-10-CM | POA: Diagnosis not present

## 2020-12-24 DIAGNOSIS — J0100 Acute maxillary sinusitis, unspecified: Secondary | ICD-10-CM | POA: Diagnosis not present

## 2021-01-06 DIAGNOSIS — J0100 Acute maxillary sinusitis, unspecified: Secondary | ICD-10-CM | POA: Diagnosis not present

## 2021-01-06 DIAGNOSIS — R051 Acute cough: Secondary | ICD-10-CM | POA: Diagnosis not present

## 2021-01-06 DIAGNOSIS — R0981 Nasal congestion: Secondary | ICD-10-CM | POA: Diagnosis not present

## 2021-02-23 DIAGNOSIS — Z23 Encounter for immunization: Secondary | ICD-10-CM | POA: Diagnosis not present

## 2021-03-09 DIAGNOSIS — G4733 Obstructive sleep apnea (adult) (pediatric): Secondary | ICD-10-CM | POA: Insufficient documentation

## 2021-03-09 DIAGNOSIS — J302 Other seasonal allergic rhinitis: Secondary | ICD-10-CM | POA: Insufficient documentation

## 2021-03-10 ENCOUNTER — Other Ambulatory Visit: Payer: Self-pay

## 2021-03-10 ENCOUNTER — Ambulatory Visit (INDEPENDENT_AMBULATORY_CARE_PROVIDER_SITE_OTHER): Payer: Medicare Other | Admitting: Cardiology

## 2021-03-10 ENCOUNTER — Encounter: Payer: Self-pay | Admitting: Cardiology

## 2021-03-10 VITALS — BP 148/80 | HR 83 | Ht 67.0 in | Wt 217.0 lb

## 2021-03-10 DIAGNOSIS — R0609 Other forms of dyspnea: Secondary | ICD-10-CM

## 2021-03-10 DIAGNOSIS — R0789 Other chest pain: Secondary | ICD-10-CM | POA: Diagnosis not present

## 2021-03-10 DIAGNOSIS — G4733 Obstructive sleep apnea (adult) (pediatric): Secondary | ICD-10-CM | POA: Diagnosis not present

## 2021-03-10 DIAGNOSIS — E785 Hyperlipidemia, unspecified: Secondary | ICD-10-CM

## 2021-03-10 DIAGNOSIS — I10 Essential (primary) hypertension: Secondary | ICD-10-CM | POA: Diagnosis not present

## 2021-03-10 NOTE — Patient Instructions (Signed)

## 2021-03-10 NOTE — Progress Notes (Signed)
Cardiology Office Note:    Date:  03/10/2021   ID:  Ricky Perry, DOB [birth year], MRN [id number]  PCP:  [name], MD  Cardiologist:  [name], MD    Referring MD: [name], MD   Chief Complaint  Patient presents with   Follow-up  I am short of breath  History of Present Illness:    Ricky Perry is a 70 y.o. male with past medical history significant for COVID-19 infection, he got mild obstructive sleep apnea normal pressure hydrocephalus essential hypertension.  He was referred to [place] because of dyspnea on exertion.  It happened since COVID however did not improve all we has to be anticipated after recovering from [place].  We did cardiac testing which included echocardiogram showing preserved left ventricle ejection fraction, he did have grade 1 diastolic dysfunction which indicate normal pulm artery wedge pressure at the time of the study, pulm artery pressure was normal.  Basically there were no explanation for his shortness of breath based on echocardiogram.  He also got stress test to rule out ischemia as potential explanation for it also negative.  He still have shortness of breath may be a little bit better.  Denies have any chest pain tightness squeezing pressure burning chest.  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  But still okay to work.  Past Medical History:  Diagnosis Date   DOE (dyspnea on exertion)    Essential hypertension    Mild obstructive sleep apnea    NPH (normal pressure hydrocephalus) ([place]) 02/12/2018   Seasonal allergies     Past Surgical History:  Procedure Laterality Date   HAND SURGERY     NECK SURGERY     SHOULDER SURGERY Bilateral    VENTRICULOPERITONEAL SHUNT  02/13/2018   By Meade Maw at [facility]    Current Medications: Current Meds  Medication Sig   amLODipine (NORVASC) 2.5 MG tablet Take 2.5 mg by mouth  daily.   amoxicillin (AMOXIL) 500 MG capsule Take 2,000 mg by mouth once. 1 hour prior to dental appointment   Azelastine HCl 137 MCG/SPRAY SOLN Place 2 sprays into both nostrils 2 (two) times daily.   BREZTRI AEROSPHERE 160-9-4.8 MCG/ACT AERO Inhale 180 mcg into the lungs daily.   citalopram (CELEXA) 20 MG tablet Take 20 mg by mouth daily.   gabapentin (NEURONTIN) 400 MG capsule Take 400 mg by mouth 2 (two) times daily.   levocetirizine (XYZAL) 5 MG tablet Take 5 mg by mouth at bedtime.   montelukast (SINGULAIR) 10 MG tablet Take 10 mg by mouth daily.   naproxen (NAPROSYN) 500 MG tablet Take 500 mg by mouth 2 (two) times daily as needed for pain.   Nutritional Supplements (KETO PO) Take 1 tablet by mouth daily. Unknown strength   omeprazole (PRILOSEC) 40 MG capsule Take 40 mg by mouth daily.   tamsulosin (FLOMAX) 0.4 MG CAPS capsule Take 0.4 mg by mouth daily.   traZODone (DESYREL) 100 MG tablet Take 100 mg by mouth at bedtime.     Allergies:   Sulfamethoxazole-trimethoprim, Cephalosporins, Oxycodone-acetaminophen, and Tape   Social History   Socioeconomic History   Marital status: Married    Spouse name: Not on file   Number of children: Not on file   Years of education: Not on file   Highest education level: Not on file  Occupational History   Not on file  Tobacco Use  Smoking status: Never   Smokeless tobacco: Never  Substance and Sexual Activity   Alcohol use: Never   Drug use: Never   Sexual activity: Yes  Other Topics Concern   Not on file  Social History Narrative   Not on file   Social Determinants of Health   Financial Resource Strain: Not on file  Food Insecurity: Not on file  Transportation Needs: Not on file  Physical Activity: Not on file  Stress: Not on file  Social Connections: Not on file     Family History: The patient's family history includes Hypertension in his mother; Lung cancer in his father. ROS:   Please see the history of present  illness.    All 14 point review of systems negative except as described per history of present illness  EKGs/Labs/Other Studies Reviewed:      Recent Labs: No results found for requested labs within last 8760 hours.  Recent Lipid Panel No results found for: CHOL, TRIG, HDL, CHOLHDL, VLDL, LDLCALC, LDLDIRECT  Physical Exam:    VS:  BP (!) 148/80 (BP Location: Left Arm, Patient Position: Sitting)   Pulse 83   Ht 5\' 7"  (1.702 m)   Wt 217 lb (98.4 kg)   SpO2 94%   BMI 33.99 kg/m     Wt Readings from Last 3 Encounters:  03/10/21 217 lb (98.4 kg)  12/16/20 216 lb (98 kg)  12/01/20 216 lb 9.6 oz (98.2 kg)     GEN:  Well nourished, well developed in no acute distress HEENT: Normal NECK: No JVD; No carotid bruits LYMPHATICS: No lymphadenopathy CARDIAC: RRR, no murmurs, no rubs, no gallops RESPIRATORY:  Clear to auscultation without rales, wheezing or rhonchi  ABDOMEN: Soft, non-tender, non-distended MUSCULOSKELETAL:  No edema; No deformity  SKIN: Warm and dry LOWER EXTREMITIES: no swelling NEUROLOGIC:  Alert and oriented x 3 PSYCHIATRIC:  Normal affect   ASSESSMENT:    1. Dyspnea on exertion   2. Dyslipidemia   3. Atypical chest pain   4. Mild obstructive sleep apnea   5. Essential hypertension    PLAN:    In order of problems listed above:  Dyspnea on exertion.  No cardiac source identified yet.  I will schedule him to have appointment with pulmonary to potentially do a CT of pulmonary function test.  I did review records which showed CT of the chest done few months ago in summer that was to rule out PE.  It was negative. Dyslipidemia I did review K PN which show me HDL 37 LDL not available.  We will call primary care physician to get copy of full report. Essential hypertension blood pressure well controlled continue present management.   Medication Adjustments/Labs and Tests Ordered: Current medicines are reviewed at length with the patient today.  Concerns  regarding medicines are outlined above.  Orders Placed This Encounter  Procedures   Ambulatory referral to Pulmonology   Medication changes: No orders of the defined types were placed in this encounter.   Signed, Park Liter, MD, [facility] 03/10/2021 9:03 AM    [place]

## 2021-04-20 DIAGNOSIS — J0100 Acute maxillary sinusitis, unspecified: Secondary | ICD-10-CM | POA: Diagnosis not present

## 2021-04-24 DIAGNOSIS — J0100 Acute maxillary sinusitis, unspecified: Secondary | ICD-10-CM | POA: Diagnosis not present

## 2021-04-30 DIAGNOSIS — Z20828 Contact with and (suspected) exposure to other viral communicable diseases: Secondary | ICD-10-CM | POA: Diagnosis not present

## 2021-04-30 DIAGNOSIS — J209 Acute bronchitis, unspecified: Secondary | ICD-10-CM | POA: Diagnosis not present

## 2021-05-04 ENCOUNTER — Ambulatory Visit (INDEPENDENT_AMBULATORY_CARE_PROVIDER_SITE_OTHER): Payer: Medicare Other

## 2021-05-04 ENCOUNTER — Other Ambulatory Visit: Payer: Self-pay

## 2021-05-04 ENCOUNTER — Encounter: Payer: Self-pay | Admitting: Pulmonary Disease

## 2021-05-04 ENCOUNTER — Ambulatory Visit: Payer: Medicare Other | Admitting: Pulmonary Disease

## 2021-05-04 VITALS — BP 142/74 | HR 75 | Temp 98.2°F | Ht 67.0 in | Wt 215.2 lb

## 2021-05-04 DIAGNOSIS — R918 Other nonspecific abnormal finding of lung field: Secondary | ICD-10-CM | POA: Diagnosis not present

## 2021-05-04 DIAGNOSIS — R0609 Other forms of dyspnea: Secondary | ICD-10-CM

## 2021-05-04 NOTE — Patient Instructions (Addendum)
Nice to meet you  We will get a chest xray today  We will get PFT or pulmonary function tests - next available - at your convenience.  Try to slowly increase the amount of time you can go on the treadmill  - try to do 5 days a week - then increase buy a few minutes for a couple of weeks at a time.  Return to clinic in 2 months or sooner as needed

## 2021-05-04 NOTE — Progress Notes (Signed)
@Patient  ID: Ricky Perry, male    DOB: 1951/02/18, 71 y.o.   MRN: [id number]  Chief Complaint  Patient presents with   Consult    Consult for shortness of breath. Has been occurring for 3-4 months now and unknown origin of why it occured    Referring provider: Park Liter, MD  HPI:   71 y.o. man whom we are seeing in consultation for evaluation of dyspnea on exertion.  Most recent cardiology note x2 reviewed.  Patient reports dyspnea for the last few months.  For 5 months.  Prior to that no issues.  Associate with about 20 pound weight gain in the months preceding.  No inciting event such as infection trauma etc. caused.  He does endorse a history of seasonal allergies.  No times a day with things are better or worse.  No position to make things better or worse.  No seasonal environmental factors he identified makes symptoms better or worse.  He is trialed over use Breztri without improvement in symptoms.  No other alleviating or exacerbating factors.  No chest imaging of the time of his symptoms.  He had an echocardiogram performed via cardiology over the last couple months which showed grade 1 diastolic dysfunction, mildly dilated LA, mild MVR on my review of results.  PMH: Hypertension, hyperlipidemia, headaches Surgical history: Cholecystectomy 1970, appendectomy 1972, access back surgery 2018 Family history: Endorses first-degree relatives with allergies Social history: Never smoker, from [place], lives in [place] / Pulmonary Flowsheets:   ACT:  No flowsheet data found.  MMRC: No flowsheet data found.  Epworth:  No flowsheet data found.  Tests:   FENO:  No results found for: NITRICOXIDE  PFT: No flowsheet data found.  WALK:  No flowsheet data found.  Imaging: No results found.  Lab Results: Reviewed-12/21 with normal hemoglobin CBC No results found for: WBC, RBC, HGB, HCT, PLT, MCV, MCH, MCHC, RDW, LYMPHSABS, MONOABS,  EOSABS, BASOSABS  BMET No results found for: NA, K, CL, CO2, GLUCOSE, BUN, CREATININE, CALCIUM, GFRNONAA, GFRAA  BNP No results found for: BNP  ProBNP No results found for: PROBNP  Specialty Problems       Pulmonary Problems   Dyspnea on exertion   Mild obstructive sleep apnea    Allergies  Allergen Reactions   Sulfamethoxazole-Trimethoprim Rash   Cephalosporins Hives   Oxycodone-Acetaminophen     Other reaction(s): Other (See Comments) Percocet-sleep walks    Tape Rash    Can use paper tape    Immunization History  Administered Date(s) Administered   H1N1 01/02/2018   Influenza Split 12/04/2007, 01/02/2009   Influenza, Quadrivalent, Recombinant, Inj, Pf 01/19/2017   Influenza-Unspecified 01/16/2014, 04/21/2015   Tdap 07/04/2013, 01/19/2017    Past Medical History:  Diagnosis Date   DOE (dyspnea on exertion)    Essential hypertension    Mild obstructive sleep apnea    NPH (normal pressure hydrocephalus) ([place]) 02/12/2018   Seasonal allergies     Tobacco History: Social History   Tobacco Use  Smoking Status Never  Smokeless Tobacco Never   Counseling given: Not Answered   Continue to not smoke  Outpatient Encounter Medications as of 05/04/2021  Medication Sig   amLODipine (NORVASC) 2.5 MG tablet Take 2.5 mg by mouth daily.   amoxicillin (AMOXIL) 500 MG capsule Take 2,000 mg by mouth once. 1 hour prior to dental appointment   Azelastine HCl 137 MCG/SPRAY SOLN Place 2 sprays into both nostrils 2 (two) times daily.   BREZTRI  AEROSPHERE 160-9-4.8 MCG/ACT AERO Inhale 180 mcg into the lungs daily.   citalopram (CELEXA) 20 MG tablet Take 20 mg by mouth daily.   gabapentin (NEURONTIN) 400 MG capsule Take 400 mg by mouth 2 (two) times daily.   levocetirizine (XYZAL) 5 MG tablet Take 5 mg by mouth at bedtime.   montelukast (SINGULAIR) 10 MG tablet Take 10 mg by mouth daily.   naproxen (NAPROSYN) 500 MG tablet Take 500 mg by mouth 2 (two) times daily as needed  for pain.   Nutritional Supplements (KETO PO) Take 1 tablet by mouth daily. Unknown strength   omeprazole (PRILOSEC) 40 MG capsule Take 40 mg by mouth daily.   tamsulosin (FLOMAX) 0.4 MG CAPS capsule Take 0.4 mg by mouth daily.   traZODone (DESYREL) 100 MG tablet Take 100 mg by mouth at bedtime.   No facility-administered encounter medications on file as of 05/04/2021.     Review of Systems  Review of Systems  No chest pain with exertion.  No orthopnea or PND.  Comprehensive review of systems otherwise negative. Physical Exam  BP (!) 142/74 (BP Location: Left Arm, Patient Position: Sitting, Cuff Size: Normal)    Pulse 75    Temp 98.2 F (36.8 C) (Oral)    Ht 5\' 7"  (1.702 m)    Wt 215 lb 3.2 oz (97.6 kg)    SpO2 98%    BMI 33.71 kg/m   Wt Readings from Last 5 Encounters:  05/04/21 215 lb 3.2 oz (97.6 kg)  03/10/21 217 lb (98.4 kg)  12/16/20 216 lb (98 kg)  12/01/20 216 lb 9.6 oz (98.2 kg)  11/12/20 221 lb (100.2 kg)    BMI Readings from Last 5 Encounters:  05/04/21 33.71 kg/m  03/10/21 33.99 kg/m  12/16/20 33.83 kg/m  12/01/20 33.92 kg/m  11/12/20 34.61 kg/m     Physical Exam  General: Well-appearing, no acute distress Eyes: EOMI, icterus Neck: Supple, no JVP Head: Tunneled shunt right side of skull Pulmonary: Clear, normal work of breathing, no wheeze Cardiovascular regular in rhythm, no murmur Abdomen: Nondistended, bowel sounds present MSK: No synovitis, no joint effusion Neuro: Normal gait, no weakness Psych: Normal mood, full affect  Assessment & Plan:   Dyspnea on exertion: Possibly related to weight gain given 20 pound weight gain over timeframe of worsening dyspnea.  Likely component of deconditioning.  TTE with left atrial dilation, grade 1 diastolic function, mild MVR, possibly contribute from cardiac causes.  We will obtain PFTs and chest x-ray today to evaluate for pulmonary causes.  Did not respond to [facility] so asthma felt unlikely.   Return in  about 2 months (around 07/02/2021).   Lanier Clam, MD 05/04/2021

## 2021-06-01 DIAGNOSIS — L821 Other seborrheic keratosis: Secondary | ICD-10-CM | POA: Diagnosis not present

## 2021-07-02 ENCOUNTER — Ambulatory Visit (INDEPENDENT_AMBULATORY_CARE_PROVIDER_SITE_OTHER): Payer: Medicare Other | Admitting: Pulmonary Disease

## 2021-07-02 ENCOUNTER — Encounter: Payer: Self-pay | Admitting: Pulmonary Disease

## 2021-07-02 VITALS — BP 132/76 | HR 83 | Temp 98.6°F | Ht 67.0 in | Wt 214.0 lb

## 2021-07-02 DIAGNOSIS — R942 Abnormal results of pulmonary function studies: Secondary | ICD-10-CM | POA: Diagnosis not present

## 2021-07-02 DIAGNOSIS — R0609 Other forms of dyspnea: Secondary | ICD-10-CM

## 2021-07-02 LAB — PULMONARY FUNCTION TEST
DL/VA % pred: 126 %
DL/VA: 5.22 ml/min/mmHg/L
DLCO cor % pred: 80 %
DLCO cor: 19.33 ml/min/mmHg
DLCO unc % pred: 79 %
DLCO unc: 19.05 ml/min/mmHg
FEF 25-75 Post: 1.63 L/sec
FEF 25-75 Pre: 2.83 L/sec
FEF2575-%Change-Post: -42 %
FEF2575-%Pred-Post: 70 %
FEF2575-%Pred-Pre: 123 %
FEV1-%Change-Post: -6 %
FEV1-%Pred-Post: 65 %
FEV1-%Pred-Pre: 70 %
FEV1-Post: 1.94 L
FEV1-Pre: 2.08 L
FEV1FVC-%Change-Post: -2 %
FEV1FVC-%Pred-Pre: 113 %
FEV6-%Change-Post: -3 %
FEV6-%Pred-Post: 62 %
FEV6-%Pred-Pre: 64 %
FEV6-Post: 2.36 L
FEV6-Pre: 2.43 L
FEV6FVC-%Pred-Post: 106 %
FEV6FVC-%Pred-Pre: 106 %
FVC-%Change-Post: -4 %
FVC-%Pred-Post: 58 %
FVC-%Pred-Pre: 61 %
FVC-Post: 2.36 L
FVC-Pre: 2.47 L
Post FEV1/FVC ratio: 82 %
Post FEV6/FVC ratio: 100 %
Pre FEV1/FVC ratio: 84 %
Pre FEV6/FVC Ratio: 100 %
RV % pred: 43 %
RV: 0.97 L
TLC % pred: 53 %
TLC: 3.43 L

## 2021-07-02 NOTE — Patient Instructions (Signed)
Full PFT performed today. °
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
## 2021-07-02 NOTE — Progress Notes (Signed)
Full PFT performed today. °

## 2021-07-13 DIAGNOSIS — J849 Interstitial pulmonary disease, unspecified: Secondary | ICD-10-CM | POA: Diagnosis not present

## 2021-07-13 DIAGNOSIS — I700 Atherosclerosis of aorta: Secondary | ICD-10-CM | POA: Diagnosis not present

## 2021-07-13 DIAGNOSIS — R942 Abnormal results of pulmonary function studies: Secondary | ICD-10-CM | POA: Diagnosis not present

## 2021-07-13 DIAGNOSIS — I2510 Atherosclerotic heart disease of native coronary artery without angina pectoris: Secondary | ICD-10-CM | POA: Diagnosis not present

## 2021-07-13 DIAGNOSIS — S2242XA Multiple fractures of ribs, left side, initial encounter for closed fracture: Secondary | ICD-10-CM | POA: Diagnosis not present

## 2021-07-20 ENCOUNTER — Encounter: Payer: Self-pay | Admitting: Pulmonary Disease

## 2021-07-20 ENCOUNTER — Ambulatory Visit (INDEPENDENT_AMBULATORY_CARE_PROVIDER_SITE_OTHER): Payer: Medicare Other | Admitting: Pulmonary Disease

## 2021-07-20 ENCOUNTER — Telehealth: Payer: Self-pay | Admitting: Pulmonary Disease

## 2021-07-20 VITALS — BP 136/74 | HR 82 | Temp 98.2°F | Wt 211.4 lb

## 2021-07-20 DIAGNOSIS — R0609 Other forms of dyspnea: Secondary | ICD-10-CM
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
## 2021-07-20 NOTE — Patient Instructions (Signed)
Nice to see you again ? ?The CT scan does not show any signs of overt or frank pulmonary fibrosis.  It does show some abnormalities that I think are consistent with atelectasis. ? ?I recommend repeating a CT scan in 1 year to make sure things are stable. ? ?We will resume Breztri.  We will fill out manufacturing assistance paperwork and fax it off later this week.  It may take [place] a few weeks to get this approved but we will work on that. ? ?Return to clinic in 6 months or sooner if needed with Dr. Silas Flood ?

## 2021-07-21 MED ORDER — BREZTRI AEROSPHERE 160-9-4.8 MCG/ACT IN AERO: 2.0000 | INHALATION_SPRAY | Freq: Two times a day (BID) | RESPIRATORY_TRACT | 3 refills | Status: DC

## 2021-07-21 NOTE — Addendum Note (Signed)
Addended by: Retia Passe on: 07/21/2021 02:39 PM ? ? Modules accepted: Orders ? ?

## 2021-07-22 NOTE — Telephone Encounter (Signed)
Received a fax from  [place] regarding an approval for  [place]  patient assistance from 07/21/2021 to 04/03/2022. Approval letter sent to scan center. ?

## 2021-07-30 DIAGNOSIS — A084 Viral intestinal infection, unspecified: Secondary | ICD-10-CM | POA: Diagnosis not present

## 2021-07-30 DIAGNOSIS — R0982 Postnasal drip: Secondary | ICD-10-CM | POA: Diagnosis not present

## 2021-07-30 DIAGNOSIS — R197 Diarrhea, unspecified: Secondary | ICD-10-CM | POA: Diagnosis not present

## 2021-07-30 DIAGNOSIS — Z20828 Contact with and (suspected) exposure to other viral communicable diseases: Secondary | ICD-10-CM | POA: Diagnosis not present

## 2021-07-30 DIAGNOSIS — J309 Allergic rhinitis, unspecified: Secondary | ICD-10-CM | POA: Diagnosis not present

## 2021-08-17 DIAGNOSIS — R351 Nocturia: Secondary | ICD-10-CM | POA: Diagnosis not present

## 2021-08-17 DIAGNOSIS — N401 Enlarged prostate with lower urinary tract symptoms: Secondary | ICD-10-CM | POA: Diagnosis not present
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
## 2021-09-14 ENCOUNTER — Ambulatory Visit: Payer: Medicare Other | Admitting: Cardiology

## 2021-10-08 DIAGNOSIS — R351 Nocturia: Secondary | ICD-10-CM | POA: Diagnosis not present

## 2021-10-08 DIAGNOSIS — N401 Enlarged prostate with lower urinary tract symptoms: Secondary | ICD-10-CM | POA: Diagnosis not present

## 2021-10-12 DIAGNOSIS — N401 Enlarged prostate with lower urinary tract symptoms: Secondary | ICD-10-CM | POA: Diagnosis not present

## 2021-10-12 DIAGNOSIS — R351 Nocturia: Secondary | ICD-10-CM | POA: Diagnosis not present

## 2021-11-12 DIAGNOSIS — D485 Neoplasm of uncertain behavior of skin: Secondary | ICD-10-CM | POA: Diagnosis not present

## 2021-11-12 DIAGNOSIS — D225 Melanocytic nevi of trunk: Secondary | ICD-10-CM | POA: Diagnosis not present

## 2021-11-12 DIAGNOSIS — L821 Other seborrheic keratosis: Secondary | ICD-10-CM | POA: Diagnosis not present

## 2021-11-12 DIAGNOSIS — L820 Inflamed seborrheic keratosis: Secondary | ICD-10-CM | POA: Diagnosis not present

## 2021-11-26 ENCOUNTER — Encounter: Payer: Self-pay | Admitting: Cardiology

## 2021-11-26 ENCOUNTER — Other Ambulatory Visit: Payer: Self-pay

## 2021-11-26 ENCOUNTER — Other Ambulatory Visit (HOSPITAL_COMMUNITY): Payer: Self-pay

## 2021-11-26 ENCOUNTER — Ambulatory Visit (INDEPENDENT_AMBULATORY_CARE_PROVIDER_SITE_OTHER): Payer: Medicare Other | Admitting: Cardiology

## 2021-11-26 VITALS — BP 124/72 | HR 82 | Ht 67.0 in | Wt 213.8 lb

## 2021-11-26 DIAGNOSIS — R0789 Other chest pain: Secondary | ICD-10-CM | POA: Diagnosis not present

## 2021-11-26 DIAGNOSIS — G4733 Obstructive sleep apnea (adult) (pediatric): Secondary | ICD-10-CM

## 2021-11-26 DIAGNOSIS — R0609 Other forms of dyspnea: Secondary | ICD-10-CM

## 2021-11-26 DIAGNOSIS — I10 Essential (primary) hypertension: Secondary | ICD-10-CM

## 2021-11-26 NOTE — Addendum Note (Signed)
Addended by: Edwyna Shell I on: 11/26/2021 04:45 PM   Modules accepted: Orders
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
## 2021-11-26 NOTE — Patient Instructions (Signed)
Medication Instructions:  Your physician recommends that you continue on your current medications as directed. Please refer to the Current Medication list given to you today.  *If you need a refill on your cardiac medications before your next appointment, please call your pharmacy*   Lab Work: Your physician recommends that you return for lab work in:   Labs today:Lipid  If you have labs (blood work) drawn today and your tests are completely normal, you will receive your results only by: [place] (if you have MyChart) OR A paper copy in the mail If you have any lab test that is abnormal or we need to change your treatment, we will call you to review the results.   Testing/Procedures: None   Follow-Up: At [facility], you and your health needs are our priority.  As part of our continuing mission to provide you with exceptional heart care, we have created designated Provider Care Teams.  These Care Teams include your primary Cardiologist (physician) and Advanced Practice Providers (APPs -  Physician Assistants and Nurse Practitioners) who all work together to provide you with the care you need, when you need it.  We recommend signing up for the patient portal called "MyChart".  Sign up information is provided on this After Visit Summary.  MyChart is used to connect with patients for Virtual Visits (Telemedicine).  Patients are able to view lab/test results, encounter notes, upcoming appointments, etc.  Non-urgent messages can be sent to your provider as well.   To learn more about what you can do with MyChart, go to [email or web address].    Your next appointment:   6 month(s)  The format for your next appointment:   In Person  Provider:   [name], MD    Other Instructions None  Important Information About Sugar

## 2021-11-30 DIAGNOSIS — R0609 Other forms of dyspnea: Secondary | ICD-10-CM | POA: Diagnosis not present

## 2021-11-30 DIAGNOSIS — G4733 Obstructive sleep apnea (adult) (pediatric): Secondary | ICD-10-CM | POA: Diagnosis not present

## 2021-11-30 DIAGNOSIS — I10 Essential (primary) hypertension: Secondary | ICD-10-CM | POA: Diagnosis not present

## 2021-11-30 DIAGNOSIS — R0789 Other chest pain: Secondary | ICD-10-CM | POA: Diagnosis not present

## 2021-12-01 LAB — LIPID PANEL
Chol/HDL Ratio: 5.9 ratio — ABNORMAL HIGH (ref 0.0–5.0)
Cholesterol, Total: 184 mg/dL (ref 100–199)
HDL: 31 mg/dL — ABNORMAL LOW (ref >39)
LDL Chol Calc (NIH): 103 mg/dL — ABNORMAL HIGH (ref 0–99)
Triglycerides: 294 mg/dL — ABNORMAL HIGH (ref 0–149)
VLDL Cholesterol Cal: 50 mg/dL — ABNORMAL HIGH (ref 5–40)

## 2021-12-07 ENCOUNTER — Telehealth: Payer: Self-pay

## 2021-12-07 DIAGNOSIS — E785 Hyperlipidemia, unspecified: Secondary | ICD-10-CM

## 2021-12-07 MED ORDER — ATORVASTATIN CALCIUM 10 MG PO TABS: 10.0000 mg | ORAL_TABLET | Freq: Every day | ORAL | 2 refills | Status: DC

## 2021-12-07 NOTE — Telephone Encounter (Signed)
-----   Message from Park Liter, MD sent at 12/06/2021  8:03 PM EDT ----- Cholesterol mildly elevated I would suggest to try small dose of statin 9010 mg of Lipitor we will do

## 2021-12-07 NOTE — Telephone Encounter (Signed)
Spoke with Dr. Agustin Cree, verified verbally  medication is Lipitor 10 mg daily and repeat labs in 6 weeks. Patient notified of the following and aware to fast when he come in for blood work. Order on file. Rx sent to confirm pharmacy.

## 2021-12-21 DIAGNOSIS — E782 Mixed hyperlipidemia: Secondary | ICD-10-CM | POA: Diagnosis not present

## 2021-12-21 DIAGNOSIS — N400 Enlarged prostate without lower urinary tract symptoms: Secondary | ICD-10-CM | POA: Diagnosis not present

## 2021-12-21 DIAGNOSIS — Z79899 Other long term (current) drug therapy: Secondary | ICD-10-CM | POA: Diagnosis not present

## 2021-12-21 DIAGNOSIS — M25511 Pain in right shoulder: Secondary | ICD-10-CM | POA: Diagnosis not present

## 2021-12-21 DIAGNOSIS — Z0000 Encounter for general adult medical examination without abnormal findings: Secondary | ICD-10-CM | POA: Diagnosis not present

## 2021-12-21 DIAGNOSIS — I10 Essential (primary) hypertension: Secondary | ICD-10-CM | POA: Diagnosis not present

## 2021-12-21 DIAGNOSIS — Z125 Encounter for screening for malignant neoplasm of prostate: Secondary | ICD-10-CM | POA: Diagnosis not present

## 2021-12-25 DIAGNOSIS — S61412A Laceration without foreign body of left hand, initial encounter: Secondary | ICD-10-CM | POA: Diagnosis not present

## 2022-01-04 DIAGNOSIS — K5900 Constipation, unspecified: Secondary | ICD-10-CM | POA: Diagnosis not present

## 2022-01-04 DIAGNOSIS — E782 Mixed hyperlipidemia: Secondary | ICD-10-CM | POA: Diagnosis not present

## 2022-01-04 DIAGNOSIS — Z6833 Body mass index (BMI) 33.0-33.9, adult: Secondary | ICD-10-CM | POA: Diagnosis not present

## 2022-01-04 DIAGNOSIS — R1013 Epigastric pain: Secondary | ICD-10-CM | POA: Diagnosis not present

## 2022-01-06 DIAGNOSIS — K56600 Partial intestinal obstruction, unspecified as to cause: Secondary | ICD-10-CM | POA: Diagnosis not present

## 2022-01-06 DIAGNOSIS — R1032 Left lower quadrant pain: Secondary | ICD-10-CM | POA: Diagnosis not present

## 2022-01-06 DIAGNOSIS — N200 Calculus of kidney: Secondary | ICD-10-CM | POA: Diagnosis not present

## 2022-01-06 DIAGNOSIS — K219 Gastro-esophageal reflux disease without esophagitis: Secondary | ICD-10-CM | POA: Diagnosis not present

## 2022-01-06 DIAGNOSIS — Z982 Presence of cerebrospinal fluid drainage device: Secondary | ICD-10-CM | POA: Diagnosis not present

## 2022-01-06 DIAGNOSIS — R109 Unspecified abdominal pain: Secondary | ICD-10-CM | POA: Diagnosis not present

## 2022-01-06 DIAGNOSIS — K6389 Other specified diseases of intestine: Secondary | ICD-10-CM | POA: Diagnosis not present

## 2022-01-06 DIAGNOSIS — Z87442 Personal history of urinary calculi: Secondary | ICD-10-CM | POA: Diagnosis not present

## 2022-01-06 DIAGNOSIS — Z79899 Other long term (current) drug therapy: Secondary | ICD-10-CM | POA: Diagnosis not present

## 2022-01-06 DIAGNOSIS — Z91040 Latex allergy status: Secondary | ICD-10-CM | POA: Diagnosis not present

## 2022-01-06 DIAGNOSIS — Z881 Allergy status to other antibiotic agents status: Secondary | ICD-10-CM | POA: Diagnosis not present

## 2022-01-06 DIAGNOSIS — Z8669 Personal history of other diseases of the nervous system and sense organs: Secondary | ICD-10-CM | POA: Diagnosis not present

## 2022-01-06 DIAGNOSIS — I10 Essential (primary) hypertension: Secondary | ICD-10-CM | POA: Diagnosis not present

## 2022-01-06 DIAGNOSIS — K529 Noninfective gastroenteritis and colitis, unspecified: Secondary | ICD-10-CM | POA: Diagnosis not present

## 2022-01-06 DIAGNOSIS — N179 Acute kidney failure, unspecified: Secondary | ICD-10-CM | POA: Diagnosis not present

## 2022-01-06 DIAGNOSIS — Z9109 Other allergy status, other than to drugs and biological substances: Secondary | ICD-10-CM | POA: Diagnosis not present

## 2022-01-06 DIAGNOSIS — N400 Enlarged prostate without lower urinary tract symptoms: Secondary | ICD-10-CM | POA: Diagnosis not present

## 2022-01-06 DIAGNOSIS — Z882 Allergy status to sulfonamides status: Secondary | ICD-10-CM | POA: Diagnosis not present

## 2022-01-06 DIAGNOSIS — K56609 Unspecified intestinal obstruction, unspecified as to partial versus complete obstruction: Secondary | ICD-10-CM | POA: Diagnosis not present

## 2022-01-06 DIAGNOSIS — M1990 Unspecified osteoarthritis, unspecified site: Secondary | ICD-10-CM | POA: Diagnosis not present

## 2022-01-07 DIAGNOSIS — R1032 Left lower quadrant pain: Secondary | ICD-10-CM | POA: Diagnosis not present

## 2022-01-07 DIAGNOSIS — K529 Noninfective gastroenteritis and colitis, unspecified: Secondary | ICD-10-CM | POA: Diagnosis not present

## 2022-01-07 DIAGNOSIS — K56600 Partial intestinal obstruction, unspecified as to cause: Secondary | ICD-10-CM | POA: Diagnosis not present

## 2022-01-07 DIAGNOSIS — N179 Acute kidney failure, unspecified: Secondary | ICD-10-CM | POA: Diagnosis not present

## 2022-01-07 DIAGNOSIS — K56609 Unspecified intestinal obstruction, unspecified as to partial versus complete obstruction: Secondary | ICD-10-CM | POA: Diagnosis not present

## 2022-01-08 DIAGNOSIS — K56600 Partial intestinal obstruction, unspecified as to cause: Secondary | ICD-10-CM | POA: Diagnosis not present

## 2022-01-08 DIAGNOSIS — N179 Acute kidney failure, unspecified: Secondary | ICD-10-CM | POA: Diagnosis not present

## 2022-01-08 DIAGNOSIS — K56609 Unspecified intestinal obstruction, unspecified as to partial versus complete obstruction: Secondary | ICD-10-CM | POA: Diagnosis not present

## 2022-01-08 DIAGNOSIS — K529 Noninfective gastroenteritis and colitis, unspecified: Secondary | ICD-10-CM | POA: Diagnosis not present

## 2022-01-08 DIAGNOSIS — R1032 Left lower quadrant pain: Secondary | ICD-10-CM | POA: Diagnosis not present

## 2022-01-08 DIAGNOSIS — N200 Calculus of kidney: Secondary | ICD-10-CM | POA: Diagnosis not present

## 2022-01-09 DIAGNOSIS — N179 Acute kidney failure, unspecified: Secondary | ICD-10-CM | POA: Diagnosis not present

## 2022-01-09 DIAGNOSIS — K56609 Unspecified intestinal obstruction, unspecified as to partial versus complete obstruction: Secondary | ICD-10-CM | POA: Diagnosis not present

## 2022-01-09 DIAGNOSIS — K529 Noninfective gastroenteritis and colitis, unspecified: Secondary | ICD-10-CM | POA: Diagnosis not present

## 2022-01-09 DIAGNOSIS — K56600 Partial intestinal obstruction, unspecified as to cause: Secondary | ICD-10-CM | POA: Diagnosis not present

## 2022-01-09 DIAGNOSIS — R1032 Left lower quadrant pain: Secondary | ICD-10-CM | POA: Diagnosis not present

## 2022-01-11 ENCOUNTER — Ambulatory Visit: Payer: PRIVATE HEALTH INSURANCE | Admitting: Pulmonary Disease

## 2022-01-11 ENCOUNTER — Ambulatory Visit (INDEPENDENT_AMBULATORY_CARE_PROVIDER_SITE_OTHER): Payer: Medicare Other | Admitting: Pulmonary Disease

## 2022-01-11 ENCOUNTER — Encounter: Payer: Self-pay | Admitting: Pulmonary Disease

## 2022-01-11 VITALS — BP 130/76 | HR 77 | Temp 98.6°F | Wt 210.8 lb

## 2022-01-11 DIAGNOSIS — G4733 Obstructive sleep apnea (adult) (pediatric): Secondary | ICD-10-CM | POA: Diagnosis not present

## 2022-01-11 DIAGNOSIS — R051 Acute cough: Secondary | ICD-10-CM | POA: Diagnosis not present

## 2022-01-11 DIAGNOSIS — N179 Acute kidney failure, unspecified: Secondary | ICD-10-CM | POA: Diagnosis not present

## 2022-01-11 DIAGNOSIS — Z7689 Persons encountering health services in other specified circumstances: Secondary | ICD-10-CM | POA: Diagnosis not present

## 2022-01-11 DIAGNOSIS — R0609 Other forms of dyspnea: Secondary | ICD-10-CM | POA: Diagnosis not present

## 2022-01-11 DIAGNOSIS — E861 Hypovolemia: Secondary | ICD-10-CM | POA: Diagnosis not present

## 2022-01-11 DIAGNOSIS — K56600 Partial intestinal obstruction, unspecified as to cause: Secondary | ICD-10-CM | POA: Diagnosis not present

## 2022-01-11 DIAGNOSIS — J309 Allergic rhinitis, unspecified: Secondary | ICD-10-CM | POA: Diagnosis not present

## 2022-01-11 NOTE — Patient Instructions (Addendum)
Nice to see you again  I am glad your lung symptoms are stable  Continue Breztri 2 puffs twice a day and today  We will likely need to reapply for the manufacturing assistance in the new year.  Please contact us and we can send you the paperwork. The 2 hospitals I work at are  [facility] and [facility].  Return to clinic in 6 months or sooner as needed with Dr. Silas Flood

## 2022-01-11 NOTE — Progress Notes (Signed)
$'@Patient'w$  ID: Justine Null, male    DOB: 1950-04-16, 71 y.o.   MRN: [id number]  Chief Complaint  Patient presents with   Follow-up    Follow up for Barrett. Pt states he just got out of the hospital recently. Pt was given levaquin, prednisone and cough medication this morning. ([facility]) this morning for sinus infection    Referring provider: [name], MD  HPI:   71 y.o. man whom we are seeing in follow up for evaluation of dyspnea on exertion.  Most recent cardiology note reviewed.  Overall he is doing well in terms of breathing.  Continues Breztri 2 puffs twice daily.  Does think it helps some with his breathing, dyspnea.  Unfortunately just hospital.  Week plus hospitalization at [facility].  This was for partial small bowel obstruction.  Fortunately this improved with medical therapies and no surgery was needed.  Discharged yesterday.  We again discussed his pulmonary function tests.  HPI at initial visit: Patient reports dyspnea for the last few months.  For 5 months.  Prior to that no issues.  Associate with about 20 pound weight gain in the months preceding.  No inciting event such as infection trauma etc. caused.  He does endorse a history of seasonal allergies.  No times a day with things are better or worse.  No position to make things better or worse.  No seasonal environmental factors he identified makes symptoms better or worse.  He is trialed over use Breztri without improvement in symptoms.  No other alleviating or exacerbating factors.  No chest imaging of the time of his symptoms.  He had an echocardiogram performed via cardiology over the last couple months which showed grade 1 diastolic dysfunction, mildly dilated LA, mild MVR on my review of results.  PMH: Hypertension, hyperlipidemia, headaches Surgical history: Cholecystectomy 1970, appendectomy 1972, access back surgery 2018 Family history: Endorses first-degree relatives with allergies Social  history: Never smoker, from [place], lives in [place] / Pulmonary Flowsheets:   ACT:      No data to display           MMRC:     No data to display           Epworth:      No data to display           Tests:   FENO:  No results found for: "NITRICOXIDE"  PFT:    Latest Ref Rng & Units 07/02/2021    2:51 PM  PFT Results  FVC-Pre L 2.47   FVC-Predicted Pre % 61   FVC-Post L 2.36   FVC-Predicted Post % 58   Pre FEV1/FVC % % 84   Post FEV1/FCV % % 82   FEV1-Pre L 2.08   FEV1-Predicted Pre % 70   FEV1-Post L 1.94   DLCO uncorrected ml/min/mmHg 19.05   DLCO UNC% % 79   DLCO corrected ml/min/mmHg 19.33   DLCO COR %Predicted % 80   DLVA Predicted % 126   TLC L 3.43   TLC % Predicted % 53   RV % Predicted % 43   Personally reviewed and interpreted spirometry suggestive of restriction versus gas trapping, restriction with reduced TLC on lung volumes, ERV severely low, DLCO not able to be adequately interpreted given did not meet ATS quality standards  WALK:      No data to display           Imaging: No results found.  Lab Results: Reviewed-12/21 with normal hemoglobin CBC No results found for: "WBC", "RBC", "HGB", "HCT", "PLT", "MCV", "MCH", "MCHC", "RDW", "LYMPHSABS", "MONOABS", "EOSABS", "BASOSABS"  BMET No results found for: "NA", "K", "CL", "CO2", "GLUCOSE", "BUN", "CREATININE", "CALCIUM", "GFRNONAA", "GFRAA"  BNP No results found for: "BNP"  ProBNP No results found for: "PROBNP"  Specialty Problems       Pulmonary Problems   Dyspnea on exertion   Mild obstructive sleep apnea    Allergies  Allergen Reactions   Sulfamethoxazole-Trimethoprim Rash   Cephalosporins Hives   Oxycodone-Acetaminophen     Other reaction(s): Other (See Comments) Percocet-sleep walks    Tape Rash    Can use paper tape    Immunization History  Administered Date(s) Administered   H1N1 01/02/2018   Influenza Split 12/04/2007,  01/02/2009   Influenza, Quadrivalent, Recombinant, Inj, Pf 01/19/2017   Influenza-Unspecified 01/16/2014, 04/21/2015   Tdap 07/04/2013, 01/19/2017    Past Medical History:  Diagnosis Date   DOE (dyspnea on exertion)    Essential hypertension    Mild obstructive sleep apnea    NPH (normal pressure hydrocephalus) ([place]) 02/12/2018   Seasonal allergies     Tobacco History: Social History   Tobacco Use  Smoking Status Never  Smokeless Tobacco Never   Counseling given: Not Answered   Continue to not smoke  Outpatient Encounter Medications as of 01/11/2022  Medication Sig   amLODipine (NORVASC) 2.5 MG tablet Take 2.5 mg by mouth daily.   amoxicillin (AMOXIL) 500 MG capsule Take 2,000 mg by mouth once. 1 hour prior to dental appointment   atorvastatin (LIPITOR) 10 MG tablet Take 1 tablet (10 mg total) by mouth daily.   Azelastine HCl 137 MCG/SPRAY SOLN Place 2 sprays into both nostrils 2 (two) times daily.   BREZTRI AEROSPHERE 160-9-4.8 MCG/ACT AERO Inhale 180 mcg into the lungs daily.   Budeson-Glycopyrrol-Formoterol (BREZTRI AEROSPHERE) 160-9-4.8 MCG/ACT AERO Inhale 2 puffs into the lungs in the morning and at bedtime.   citalopram (CELEXA) 20 MG tablet Take 20 mg by mouth daily.   gabapentin (NEURONTIN) 400 MG capsule Take 400 mg by mouth 2 (two) times daily.   levocetirizine (XYZAL) 5 MG tablet Take 5 mg by mouth at bedtime.   montelukast (SINGULAIR) 10 MG tablet Take 10 mg by mouth daily.   naproxen (NAPROSYN) 500 MG tablet Take 500 mg by mouth 2 (two) times daily as needed for pain.   Nutritional Supplements (KETO PO) Take 1 tablet by mouth daily. Unknown strength   omeprazole (PRILOSEC) 40 MG capsule Take 40 mg by mouth daily.   tamsulosin (FLOMAX) 0.4 MG CAPS capsule Take 0.4 mg by mouth daily.   traZODone (DESYREL) 100 MG tablet Take 100 mg by mouth at bedtime.   No facility-administered encounter medications on file as of 01/11/2022.     Review of Systems  Review  of Systems  N/a Physical Exam  BP 130/76 (BP Location: Left Arm, Patient Position: Sitting, Cuff Size: Normal)   Pulse 77   Temp 98.6 F (37 C) (Oral)   Wt 210 lb 12.8 oz (95.6 kg)   SpO2 95%   BMI 33.02 kg/m   Wt Readings from Last 5 Encounters:  01/11/22 210 lb 12.8 oz (95.6 kg)  11/26/21 213 lb 12.8 oz (97 kg)  07/20/21 211 lb 6.4 oz (95.9 kg)  07/02/21 214 lb (97.1 kg)  05/04/21 215 lb 3.2 oz (97.6 kg)    BMI Readings from Last 5 Encounters:  01/11/22 33.02 kg/m  11/26/21 33.49 kg/m  07/20/21 33.11 kg/m  07/02/21 33.52 kg/m  05/04/21 33.71 kg/m     Physical Exam  General: Well-appearing, no acute distress Eyes: EOMI, icterus Neck: Supple, no JVP Head: Tunneled shunt right side of skull Pulmonary: Clear, normal work of breathing, no wheeze Cardiovascular regular in rhythm, no murmur Abdomen: Nondistended, bowel sounds present MSK: No synovitis, no joint effusion Neuro: Normal gait, no weakness Psych: Normal mood, full affect  Assessment & Plan:   Dyspnea on exertion: Possibly related to weight gain given 20 pound weight gain over timeframe of worsening dyspnea.  Likely component of deconditioning.  TTE with left atrial dilation, grade 1 diastolic function, mild MVR, possibly contribute from cardiac causes.  PFTs suggestive of extraparenchymal restriction.  High-res CT 07/2021 with mild linear atelectasis in the bases, fibrosis felt unlikely but possible developing fibrosis, diminutive left lung following trauma which is chronic.  Combination of images and PFTs suggestive of extraparenchymal restriction.  Continue Breztri at this time given improvement in symptoms, possible small airways disease.   Return in about 6 months (around 07/13/2022).   Lanier Clam, MD 01/11/2022

## 2022-01-17 DIAGNOSIS — J069 Acute upper respiratory infection, unspecified: Secondary | ICD-10-CM | POA: Diagnosis not present

## 2022-01-17 DIAGNOSIS — R0981 Nasal congestion: Secondary | ICD-10-CM | POA: Diagnosis not present

## 2022-02-01 DIAGNOSIS — J069 Acute upper respiratory infection, unspecified: Secondary | ICD-10-CM | POA: Diagnosis not present

## 2022-02-01 DIAGNOSIS — R0981 Nasal congestion: Secondary | ICD-10-CM | POA: Diagnosis not present

## 2022-02-01 DIAGNOSIS — R051 Acute cough: Secondary | ICD-10-CM | POA: Diagnosis not present

## 2022-02-08 DIAGNOSIS — R051 Acute cough: Secondary | ICD-10-CM | POA: Diagnosis not present

## 2022-02-08 DIAGNOSIS — R0981 Nasal congestion: Secondary | ICD-10-CM | POA: Diagnosis not present

## 2022-02-08 DIAGNOSIS — J3489 Other specified disorders of nose and nasal sinuses: Secondary | ICD-10-CM | POA: Diagnosis not present

## 2022-02-08 DIAGNOSIS — J0100 Acute maxillary sinusitis, unspecified: Secondary | ICD-10-CM | POA: Diagnosis not present

## 2022-02-15 DIAGNOSIS — R052 Subacute cough: Secondary | ICD-10-CM | POA: Diagnosis not present

## 2022-02-15 DIAGNOSIS — J309 Allergic rhinitis, unspecified: Secondary | ICD-10-CM | POA: Diagnosis not present

## 2022-02-15 DIAGNOSIS — K219 Gastro-esophageal reflux disease without esophagitis: Secondary | ICD-10-CM | POA: Diagnosis not present

## 2022-02-15 DIAGNOSIS — R0982 Postnasal drip: Secondary | ICD-10-CM | POA: Diagnosis not present

## 2022-02-15 DIAGNOSIS — Z23 Encounter for immunization: Secondary | ICD-10-CM | POA: Diagnosis not present

## 2022-03-01 DIAGNOSIS — Z6832 Body mass index (BMI) 32.0-32.9, adult: Secondary | ICD-10-CM | POA: Diagnosis not present

## 2022-03-01 DIAGNOSIS — J329 Chronic sinusitis, unspecified: Secondary | ICD-10-CM | POA: Diagnosis not present

## 2022-03-01 DIAGNOSIS — J40 Bronchitis, not specified as acute or chronic: Secondary | ICD-10-CM | POA: Diagnosis not present

## 2022-03-04 ENCOUNTER — Other Ambulatory Visit: Payer: Self-pay | Admitting: Cardiology

## 2022-03-07 DIAGNOSIS — J40 Bronchitis, not specified as acute or chronic: Secondary | ICD-10-CM | POA: Diagnosis not present

## 2022-03-07 DIAGNOSIS — R051 Acute cough: Secondary | ICD-10-CM | POA: Diagnosis not present

## 2022-03-07 DIAGNOSIS — Z6832 Body mass index (BMI) 32.0-32.9, adult: Secondary | ICD-10-CM | POA: Diagnosis not present

## 2022-03-07 DIAGNOSIS — J329 Chronic sinusitis, unspecified: Secondary | ICD-10-CM | POA: Diagnosis not present

## 2022-03-10 DIAGNOSIS — R197 Diarrhea, unspecified: Secondary | ICD-10-CM | POA: Diagnosis not present

## 2022-03-10 DIAGNOSIS — Z6832 Body mass index (BMI) 32.0-32.9, adult: Secondary | ICD-10-CM | POA: Diagnosis not present

## 2022-03-10 DIAGNOSIS — R051 Acute cough: Secondary | ICD-10-CM | POA: Diagnosis not present

## 2022-03-18 DIAGNOSIS — F419 Anxiety disorder, unspecified: Secondary | ICD-10-CM | POA: Diagnosis not present

## 2022-03-18 DIAGNOSIS — J329 Chronic sinusitis, unspecified: Secondary | ICD-10-CM | POA: Diagnosis not present

## 2022-03-18 DIAGNOSIS — Z6832 Body mass index (BMI) 32.0-32.9, adult: Secondary | ICD-10-CM | POA: Diagnosis not present

## 2022-03-18 DIAGNOSIS — J40 Bronchitis, not specified as acute or chronic: Secondary | ICD-10-CM | POA: Diagnosis not present

## 2022-03-22 DIAGNOSIS — J0100 Acute maxillary sinusitis, unspecified: Secondary | ICD-10-CM | POA: Diagnosis not present

## 2022-04-05 DIAGNOSIS — H5213 Myopia, bilateral: Secondary | ICD-10-CM | POA: Diagnosis not present

## 2022-04-05 DIAGNOSIS — H524 Presbyopia: Secondary | ICD-10-CM | POA: Diagnosis not present

## 2022-04-05 DIAGNOSIS — H52223 Regular astigmatism, bilateral: Secondary | ICD-10-CM | POA: Diagnosis not present

## 2022-04-05 DIAGNOSIS — H25813 Combined forms of age-related cataract, bilateral: Secondary | ICD-10-CM | POA: Diagnosis not present

## 2022-04-05 DIAGNOSIS — H26052 Posterior subcapsular polar infantile and juvenile cataract, left eye: Secondary | ICD-10-CM | POA: Diagnosis not present

## 2022-04-19 DIAGNOSIS — H2511 Age-related nuclear cataract, right eye: Secondary | ICD-10-CM | POA: Diagnosis not present

## 2022-04-19 DIAGNOSIS — H2512 Age-related nuclear cataract, left eye: Secondary | ICD-10-CM | POA: Diagnosis not present

## 2022-05-05 DIAGNOSIS — H2512 Age-related nuclear cataract, left eye: Secondary | ICD-10-CM | POA: Diagnosis not present

## 2022-05-05 DIAGNOSIS — H269 Unspecified cataract: Secondary | ICD-10-CM | POA: Diagnosis not present

## 2022-05-19 DIAGNOSIS — H25811 Combined forms of age-related cataract, right eye: Secondary | ICD-10-CM | POA: Diagnosis not present

## 2022-05-19 DIAGNOSIS — H2511 Age-related nuclear cataract, right eye: Secondary | ICD-10-CM | POA: Diagnosis not present

## 2022-05-19 DIAGNOSIS — H269 Unspecified cataract: Secondary | ICD-10-CM | POA: Diagnosis not present

## 2022-05-25 IMAGING — MR MR KNEE*L* W/O CM
4 of 6 series · 23 of 40 positions shown · non-contrast
Comparison: None.

CLINICAL DATA: Left knee pain.

EXAM:
MRI OF THE LEFT KNEE WITHOUT CONTRAST
TECHNIQUE: Multiplanar, multisequence MR imaging of the knee was performed. No
intravenous contrast was administered.

[Series 7: T1 · coronal · 4.0mm · 0.29mm/px · 3 of 29 slices shown]
[im 6/29]
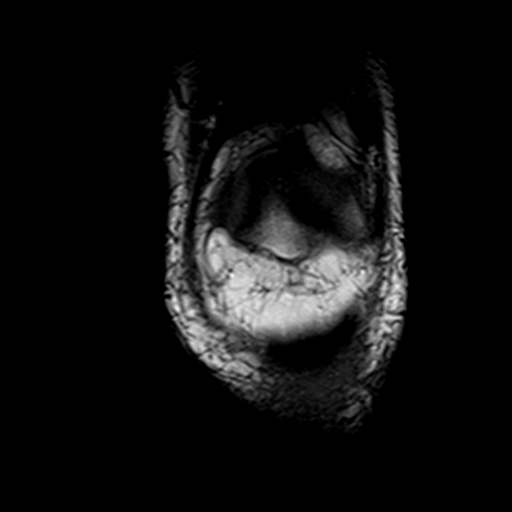
[im 17/29]
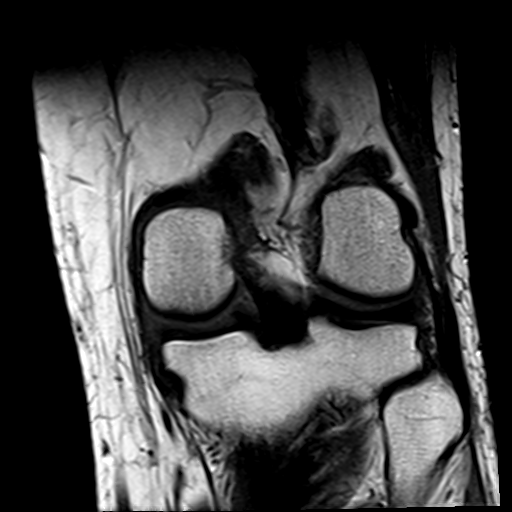
[im 29/29]
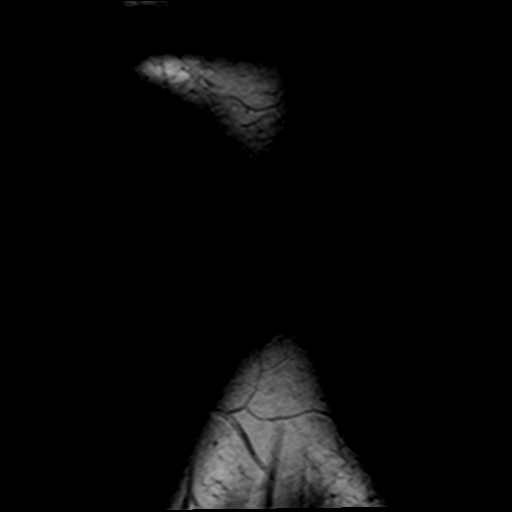

[Series 8: T2 fat-sat · coronal · 4.0mm · 0.59mm/px · 6 of 29 slices shown (1 of 2)]
[im 1/29]
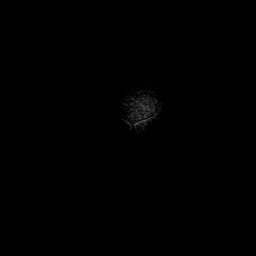
[im 6/29]
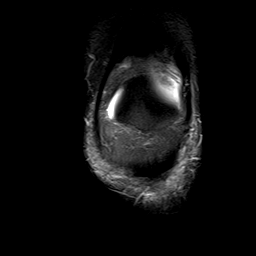
[im 12/29]
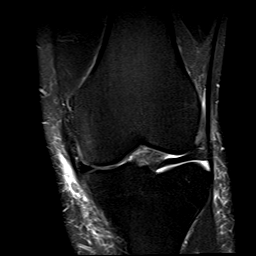
[im 17/29]
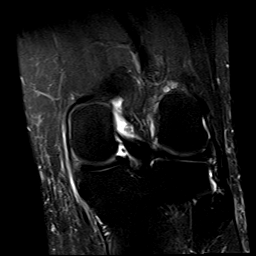
[im 23/29]
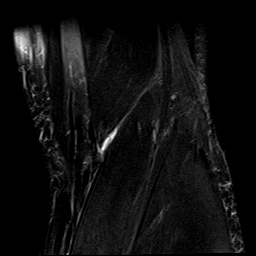
[im 29/29]
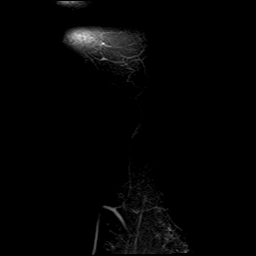

[Series 10: PD fat-sat · sagittal · 3.0mm · 0.29mm/px · 7 of 30 slices shown]
[im 1/30]
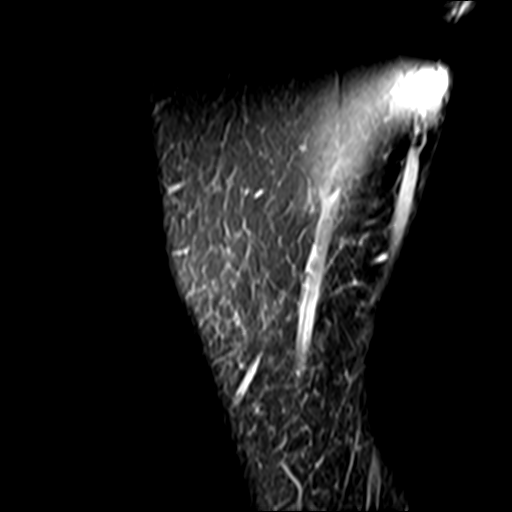
[im 5/30]
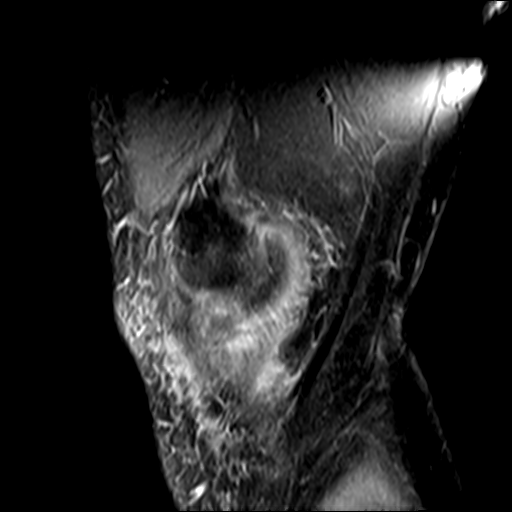
[im 10/30]
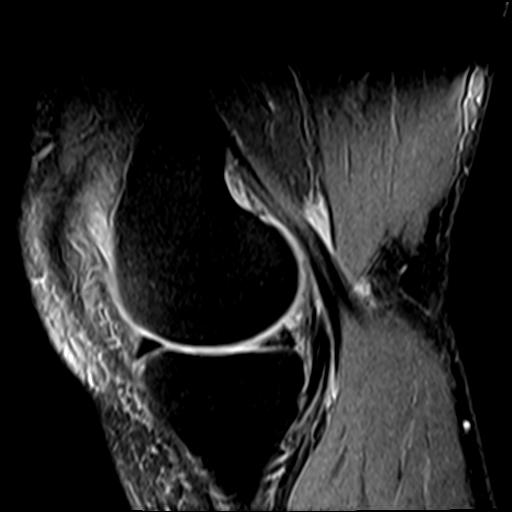
[im 15/30]
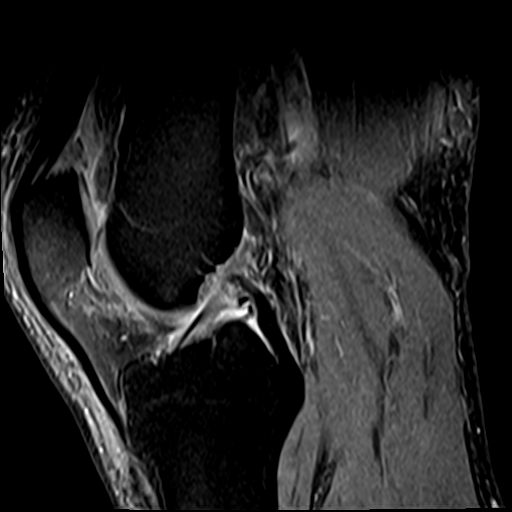
[im 20/30]
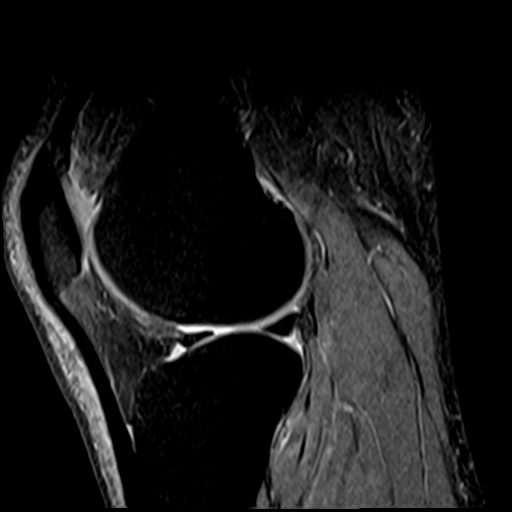
[im 25/30]
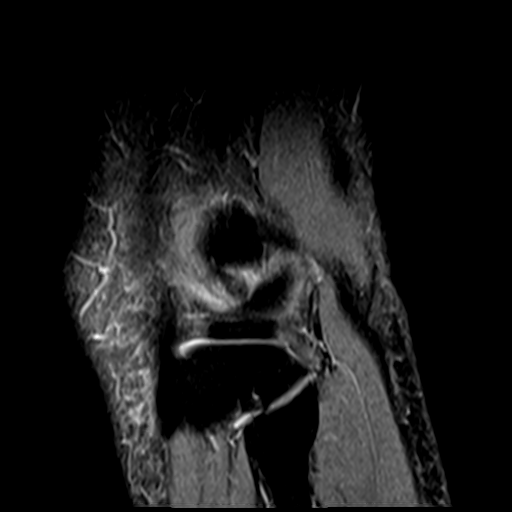
[im 30/30]
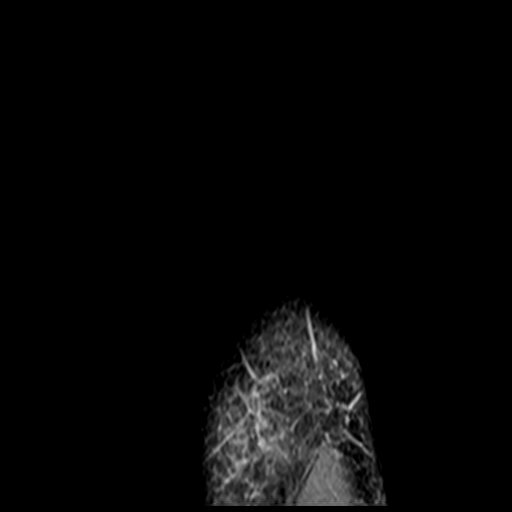

[Series 11: T2 fat-sat · sagittal · 3.0mm · 0.29mm/px · 7 of 30 slices shown (2 of 2)]
[im 1/30]
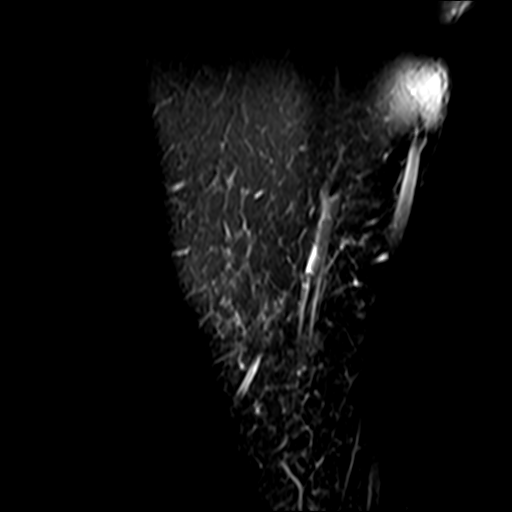
[im 5/30]
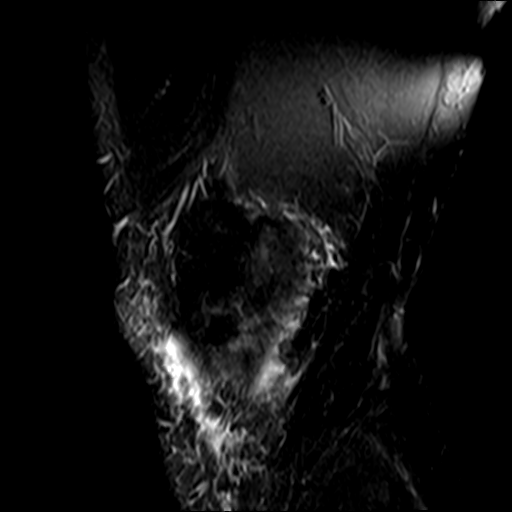
[im 10/30]
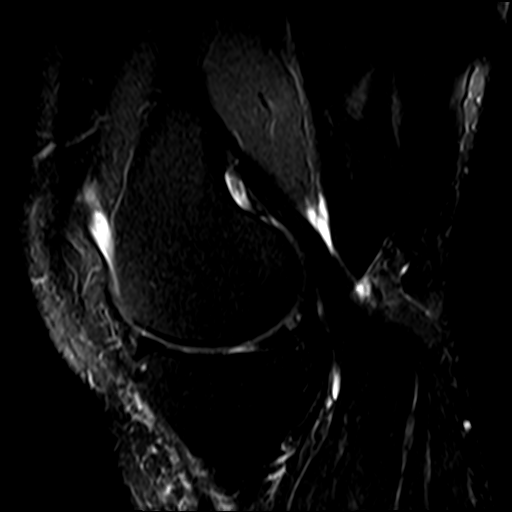
[im 15/30]
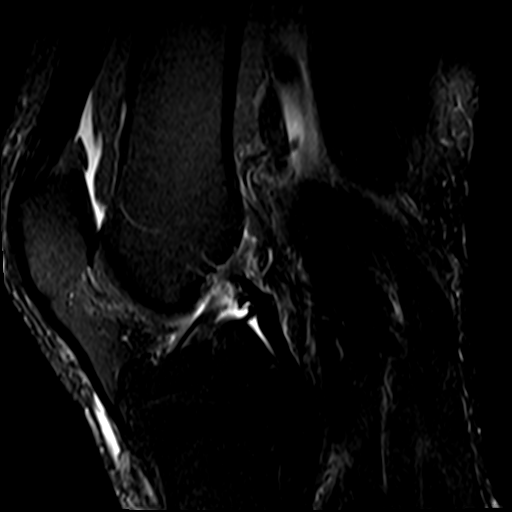
[im 20/30]
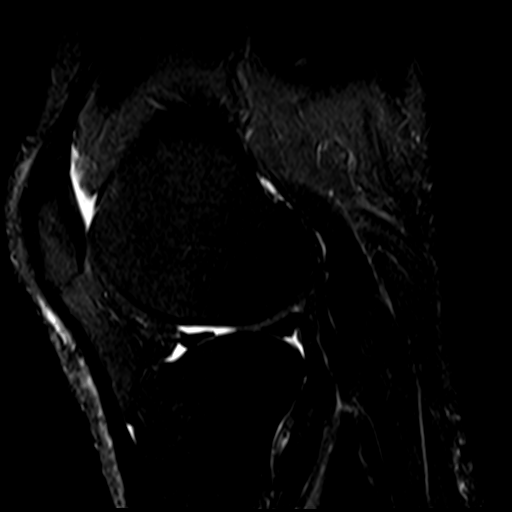
[im 25/30]
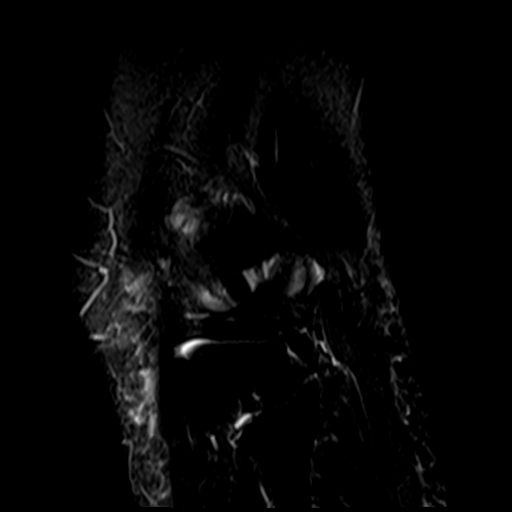
[im 30/30]
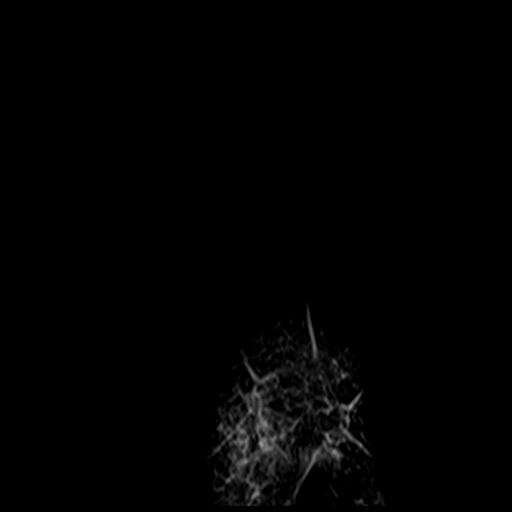

[23 of 40 positions shown; findings below may reference images not displayed]

FINDINGS: MENISCI

Medial: Complex tear of the posterior horn of the medial meniscus
extending to the posterior body with peripheral meniscal extrusion.

Lateral: Intact.

LIGAMENTS

Cruciates: ACL and PCL are intact.

Collaterals: Medial collateral ligament is intact. Lateral
collateral ligament complex is intact.

CARTILAGE

Patellofemoral:  No chondral defect.

Medial: Mild partial-thickness cartilage loss of the medial
femorotibial compartment.

Lateral:  No chondral defect.

JOINT: No joint effusion. Normal Nirupma Bende. No plical
thickening.

POPLITEAL FOSSA: Popliteus tendon is intact. Tiny Baker's cyst.

EXTENSOR MECHANISM: Intact quadriceps tendon. Intact patellar
tendon. Intact lateral patellar retinaculum. Intact medial patellar
retinaculum. Intact MPFL.

BONES: No aggressive osseous lesion. No fracture or dislocation.

Other: No fluid collection or hematoma. Muscles are normal.
IMPRESSION: 1. Complex tear of the posterior horn of the medial meniscus
extending to the posterior body with peripheral meniscal extrusion.
2. Mild partial-thickness cartilage loss of the medial femorotibial
compartment.

## 2022-06-03 DIAGNOSIS — G5791 Unspecified mononeuropathy of right lower limb: Secondary | ICD-10-CM | POA: Diagnosis not present

## 2022-06-07 ENCOUNTER — Ambulatory Visit: Payer: PRIVATE HEALTH INSURANCE | Admitting: Cardiology

## 2022-06-07 DIAGNOSIS — R200 Anesthesia of skin: Secondary | ICD-10-CM | POA: Diagnosis not present

## 2022-06-07 DIAGNOSIS — M5417 Radiculopathy, lumbosacral region: Secondary | ICD-10-CM | POA: Diagnosis not present

## 2022-06-07 DIAGNOSIS — R202 Paresthesia of skin: Secondary | ICD-10-CM | POA: Diagnosis not present

## 2022-06-07 DIAGNOSIS — Z6832 Body mass index (BMI) 32.0-32.9, adult: Secondary | ICD-10-CM | POA: Diagnosis not present

## 2022-06-14 DIAGNOSIS — M5136 Other intervertebral disc degeneration, lumbar region: Secondary | ICD-10-CM | POA: Diagnosis not present

## 2022-06-14 DIAGNOSIS — M5416 Radiculopathy, lumbar region: Secondary | ICD-10-CM | POA: Diagnosis not present

## 2022-06-21 DIAGNOSIS — E782 Mixed hyperlipidemia: Secondary | ICD-10-CM | POA: Diagnosis not present

## 2022-06-21 DIAGNOSIS — R350 Frequency of micturition: Secondary | ICD-10-CM | POA: Diagnosis not present

## 2022-06-21 DIAGNOSIS — N179 Acute kidney failure, unspecified: Secondary | ICD-10-CM | POA: Diagnosis not present

## 2022-06-21 DIAGNOSIS — I10 Essential (primary) hypertension: Secondary | ICD-10-CM | POA: Diagnosis not present

## 2022-06-21 DIAGNOSIS — E861 Hypovolemia: Secondary | ICD-10-CM | POA: Diagnosis not present

## 2022-06-24 ENCOUNTER — Telehealth: Payer: Self-pay

## 2022-06-24 DIAGNOSIS — Z982 Presence of cerebrospinal fluid drainage device: Secondary | ICD-10-CM

## 2022-06-24 NOTE — Telephone Encounter (Signed)
Patient has decided to switch care from [facility] in [place] to our office. He has an appt with Stacy on 07/01/2022.

## 2022-06-24 NOTE — Telephone Encounter (Signed)
Skull xray has been ordered. Dr Izora Ribas would like him to have the skull xray done within the [facility] system prior to the MRI so we know what setting the shunt is at.

## 2022-06-24 NOTE — Telephone Encounter (Signed)
[facility] of [facility] are ordering a MRI lumbar, that appt has not been scheduled yet. he just wants to make sure that Dr.Yarbrough can reprogram his shunt.   Per  Dr.Yarbrough and Ophelia Shoulder: We can reprogram it, but he needs to come here before the MRI to have a skull xray done so we know what setting the shunt is at prior to him having the MRI b/c the MRI magnet can change the setting of the shunt. Once he knows the MRI appointment, he can call us to coordinate an appt with [place] to reprogram it. Or he can have the MRI here on a Tues or Thurs and see [place] the same day right after the MRI and then take a CD of his MRI back to [place].   I explained this to the patient. They will try and have the MRI here in [place] to be close to Dr.Yarbrough's office on a Tuesday since that is his day off. They are aware that he would need to have the xray prior to the MRI. They asked which imaging offices were here close by. I gave them [facility] and [facility]. He will give that information to [facility].    He will call back with is MRI date.
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
## 2022-07-01 ENCOUNTER — Ambulatory Visit (INDEPENDENT_AMBULATORY_CARE_PROVIDER_SITE_OTHER): Payer: Medicare Other | Admitting: Orthopedic Surgery

## 2022-07-01 ENCOUNTER — Ambulatory Visit
Admission: RE | Admit: 2022-07-01 | Discharge: 2022-07-01 | Disposition: A | Discharge status: Home / Self Care | Payer: Medicare Other | Source: Ambulatory Visit | Attending: Neurosurgery | Admitting: Neurosurgery

## 2022-07-01 ENCOUNTER — Encounter: Payer: Self-pay | Admitting: Orthopedic Surgery

## 2022-07-01 ENCOUNTER — Ambulatory Visit
Admission: RE | Admit: 2022-07-01 | Discharge: 2022-07-01 | Disposition: A | Discharge status: Home / Self Care | Payer: Medicare Other | Attending: Neurosurgery | Admitting: Neurosurgery

## 2022-07-01 ENCOUNTER — Inpatient Hospital Stay
Admission: RE | Admit: 2022-07-01 | Discharge: 2022-07-01 | Disposition: A | Discharge status: Home / Self Care | Payer: Self-pay | Source: Ambulatory Visit | Attending: Orthopedic Surgery | Admitting: Orthopedic Surgery

## 2022-07-01 ENCOUNTER — Other Ambulatory Visit: Payer: Self-pay

## 2022-07-01 VITALS — BP 160/90 | HR 72 | Ht 67.0 in | Wt 210.0 lb

## 2022-07-01 DIAGNOSIS — Z4541 Encounter for adjustment and management of cerebrospinal fluid drainage device: Secondary | ICD-10-CM | POA: Diagnosis not present

## 2022-07-01 DIAGNOSIS — M47816 Spondylosis without myelopathy or radiculopathy, lumbar region: Secondary | ICD-10-CM

## 2022-07-01 DIAGNOSIS — Z049 Encounter for examination and observation for unspecified reason: Secondary | ICD-10-CM

## 2022-07-01 DIAGNOSIS — M5136 Other intervertebral disc degeneration, lumbar region: Secondary | ICD-10-CM | POA: Diagnosis not present

## 2022-07-01 DIAGNOSIS — Z982 Presence of cerebrospinal fluid drainage device: Secondary | ICD-10-CM

## 2022-07-01 DIAGNOSIS — M5416 Radiculopathy, lumbar region: Secondary | ICD-10-CM

## 2022-07-01 DIAGNOSIS — M4726 Other spondylosis with radiculopathy, lumbar region: Secondary | ICD-10-CM | POA: Diagnosis not present

## 2022-07-01 NOTE — Patient Instructions (Signed)
It was so nice to see you today. Thank you so much for coming in.    You have some wear and tear (arthritis) in your lower back and this is likely causing your pain.   I want to get an MRI of your lower back to look into things further. We will get this approved through your insurance and [facility] will call you to schedule the appointment. MRI will need to be done on a Tuesday or a Thursday.   You need to get the xrays done of your skull prior to the MRI.   You can get these at [place] (building with the white pillars) off of [address]. The address is [address] [place], [place] [address]. You do not need any appointment.   You will need to see Dr. Izora Ribas the same day that you have your MRI to get your shunt reset.   Your blood pressure was elevated today, it was 160/90. I want you to recheck it at home and follow up with your PCP if it remains high. If you have any chest pain, shortness of breath, blurry vision, or headaches then you need to go to ED.    We will call you to schedule a follow up with me once I have your MRI results.   Please do not hesitate to call if you have any questions or concerns. You can also message me in [place].   Geronimo Boot PA-C [phone]

## 2022-08-02 ENCOUNTER — Encounter: Payer: Self-pay | Admitting: Neurosurgery

## 2022-08-02 ENCOUNTER — Ambulatory Visit: Payer: PRIVATE HEALTH INSURANCE | Admitting: Neurosurgery

## 2022-08-02 ENCOUNTER — Ambulatory Visit (INDEPENDENT_AMBULATORY_CARE_PROVIDER_SITE_OTHER): Payer: Medicare Other | Admitting: Neurosurgery

## 2022-08-02 ENCOUNTER — Ambulatory Visit
Admission: RE | Admit: 2022-08-02 | Discharge: 2022-08-02 | Disposition: A | Discharge status: Home / Self Care | Payer: Medicare Other | Source: Ambulatory Visit | Attending: Orthopedic Surgery | Admitting: Orthopedic Surgery

## 2022-08-02 VITALS — BP 140/81 | HR 85 | Wt 207.4 lb

## 2022-08-02 DIAGNOSIS — M5136 Other intervertebral disc degeneration, lumbar region: Secondary | ICD-10-CM

## 2022-08-02 DIAGNOSIS — Z982 Presence of cerebrospinal fluid drainage device: Secondary | ICD-10-CM

## 2022-08-02 DIAGNOSIS — M5416 Radiculopathy, lumbar region: Secondary | ICD-10-CM

## 2022-08-02 DIAGNOSIS — Z4541 Encounter for adjustment and management of cerebrospinal fluid drainage device: Secondary | ICD-10-CM | POA: Diagnosis not present

## 2022-08-02 DIAGNOSIS — M5459 Other low back pain: Secondary | ICD-10-CM | POA: Diagnosis not present

## 2022-08-02 DIAGNOSIS — M47816 Spondylosis without myelopathy or radiculopathy, lumbar region: Secondary | ICD-10-CM

## 2022-08-02 NOTE — Progress Notes (Signed)
   Progress Note: Referring Physician:  Buckner Malta, MD [address] [place],  [place] [address]  Primary Physician:  Buckner Malta, MD  Chief Complaint:  f/u after MRI for shunt check  History of Present Illness: Ricky Perry is a 72 y.o. male who presents today after MRI for reprogramming of his [place] shunt.  He denies any change in his balance, confusion, or dizziness.  Exam: Today's Vitals   08/02/22 1348  BP: (!) 140/81  Pulse: 85  Weight: 94.1 kg  PainSc: 5   PainLoc: Back   Body mass index is 32.48 kg/m.    NEUROLOGICAL:  General: In no acute distress.   Awake, alert, oriented to person, place, and time.  Pupils equal round and reactive to light.  Facial tone is symmetric.   Strength: MAEW  Imaging: MRI L spine pending  .  Assessment and Plan: Ricky Perry is a pleasant 72 y.o. male with history of VP shunt placement.  He underwent an MRI of his lumbar spine today for further evaluation of lumbosacral symptoms.  His shunt was reprogrammed to 180.  Will contact him with results of his MRI and further plan of care regarding his lumbar spine once they have resulted.  He was encouraged to call the office in the interim with any questions or concerns.  He expressed understanding was in agreement with this plan.  I spent a total of 15 minutes in both face-to-face and non-face-to-face activities for this visit on the date of this encounter including reprogramming of his VP shunt.  Manning Charity PA-C Neurosurgery

## 2022-08-19 ENCOUNTER — Ambulatory Visit: Payer: PRIVATE HEALTH INSURANCE | Admitting: Cardiology
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
## 2022-09-02 ENCOUNTER — Encounter: Payer: Self-pay | Admitting: Orthopedic Surgery

## 2022-09-02 ENCOUNTER — Ambulatory Visit (INDEPENDENT_AMBULATORY_CARE_PROVIDER_SITE_OTHER): Payer: Medicare Other | Admitting: Orthopedic Surgery

## 2022-09-02 VITALS — BP 137/78 | Ht 67.0 in | Wt 207.0 lb

## 2022-09-02 DIAGNOSIS — M5416 Radiculopathy, lumbar region: Secondary | ICD-10-CM

## 2022-09-02 DIAGNOSIS — M4726 Other spondylosis with radiculopathy, lumbar region: Secondary | ICD-10-CM | POA: Diagnosis not present

## 2022-09-02 DIAGNOSIS — M5136 Other intervertebral disc degeneration, lumbar region: Secondary | ICD-10-CM

## 2022-09-02 DIAGNOSIS — M47816 Spondylosis without myelopathy or radiculopathy, lumbar region: Secondary | ICD-10-CM

## 2022-09-02 NOTE — Patient Instructions (Signed)
It was so nice to see you today. Thank you so much for coming in.    You have some wear and tear in your back that is likely causing your back and leg pain.   I want you to see physical medicine and rehab at the [facility] to discuss possible injections in your back. Dr. Yves Dill, Dr. Mariah Milling, and their PA Alphonzo Lemmings are great and will take good care of you. They should call you to schedule an appointment or you can call them at [phone].   We can revisit physical therapy if needed.   We have a phone visit scheduled in July.   Please do not hesitate to call if you have any questions or concerns. You can also message me in MyChart.   Drake Leach PA-C [phone]

## 2022-09-04 NOTE — Progress Notes (Unsigned)
Cardiology Office Note:    Date:  09/06/2022   ID:  Ricky Perry, DOB 01/20/1951, MRN [id number]  PCP:  Buckner Malta, MD   [facility] Providers Cardiologist:  Gypsy Balsam, MD     Referring MD: Buckner Malta, MD   Chief Complaint  Patient presents with   Follow-up    History of Present Illness:    Ricky Perry is a 72 y.o. male with a hx of hypertension, mild OSA, DOE, normal pressure hydrocephalus shunt.  He establish care with Dr. Bing Matter in August 2022 at the behest of his PCP for evaluation of shortness of breath.  Echocardiogram was arranged and completed on 12/16/2020 which revealed an EF of 60 to 65%, grade 1 DD, mild MR.  A stress test was arranged and completed on 12/16/2020 as well which was negative for ischemia.  Most recently was evaluated by Dr. Bing Matter on 11/26/2021, at this time he was doing well from a cardiac perspective.  He presents today, by his wife for follow-up of his shortness of breath.  He states he is feeling well overall, is not bothered by shortness of breath unless he exerts himself however he states this is somewhat due to his weight and deconditioning.  He has no formal complaints today.  He [place].  Blood pressure is elevated in the office today, states he checks it at home and it is typically in the 140 range systolic. He denies chest pain, palpitations, dyspnea, pnd, orthopnea, n, v, dizziness, syncope, edema, weight gain, or early satiety.   Past Medical History:  Diagnosis Date   DOE (dyspnea on exertion)    Essential hypertension    Mild obstructive sleep apnea    NPH (normal pressure hydrocephalus) (HCC) 02/12/2018   Seasonal allergies     Past Surgical History:  Procedure Laterality Date   HAND SURGERY     NECK SURGERY     SHOULDER SURGERY Bilateral    VENTRICULOPERITONEAL SHUNT  02/13/2018   By Venetia Night at [facility]    Current  Medications: Current Meds  Medication Sig   amoxicillin (AMOXIL) 500 MG capsule Take 2,000 mg by mouth once. 1 hour prior to dental appointment   atorvastatin (LIPITOR) 10 MG tablet TAKE 1 TABLET BY MOUTH EVERY DAY   Azelastine HCl 137 MCG/SPRAY SOLN Place 2 sprays into both nostrils 2 (two) times daily.   citalopram (CELEXA) 20 MG tablet Take 20 mg by mouth daily.   fexofenadine (ALLEGRA) 180 MG tablet Take 180 mg by mouth daily.   gabapentin (NEURONTIN) 400 MG capsule Take 400 mg by mouth 2 (two) times daily.   lisinopril (ZESTRIL) 5 MG tablet Take 1 tablet (5 mg total) by mouth daily.   naproxen (NAPROSYN) 500 MG tablet Take 500 mg by mouth 2 (two) times daily as needed for pain.   Nutritional Supplements (KETO PO) Take 1 tablet by mouth daily. Unknown strength   omeprazole (PRILOSEC) 40 MG capsule Take 40 mg by mouth daily.   tamsulosin (FLOMAX) 0.4 MG CAPS capsule Take 0.4 mg by mouth daily.   traZODone (DESYREL) 100 MG tablet Take 100 mg by mouth at bedtime.   [DISCONTINUED] lisinopril (ZESTRIL) 2.5 MG tablet Take 2.5 mg by mouth daily.     Allergies:   Sulfamethoxazole-trimethoprim, Cephalosporins, Oxycodone-acetaminophen, and Tape   Social History   Socioeconomic History   Marital status: Married    Spouse name: Not on file   Number  of children: Not on file   Years of education: Not on file   Highest education level: Not on file  Occupational History   Not on file  Tobacco Use   Smoking status: Never   Smokeless tobacco: Never  Substance and Sexual Activity   Alcohol use: Never   Drug use: Never   Sexual activity: Yes  Other Topics Concern   Not on file  Social History Narrative   Not on file   Social Determinants of Health   Financial Resource Strain: Not on file  Food Insecurity: Not on file  Transportation Needs: Not on file  Physical Activity: Not on file  Stress: Not on file  Social Connections: Not on file     Family History: The patient's family  history includes Hypertension in his mother; Lung cancer in his father.  ROS:   Please see the history of present illness.     All other systems reviewed and are negative.  EKGs/Labs/Other Studies Reviewed:    The following studies were reviewed today: Cardiac Studies & Procedures     STRESS TESTS  MYOCARDIAL PERFUSION IMAGING 12/16/2020  Narrative   The study is normal. The study is low risk.   Left ventricular function is normal. Nuclear stress EF: 53 %. The left ventricular ejection fraction is mildly decreased (45-54%).   Prior study not available for comparison.   ECHOCARDIOGRAM  ECHOCARDIOGRAM COMPLETE 12/17/2020  Narrative ECHOCARDIOGRAM REPORT    Patient Name:   Ricky Perry [facility] Date of Exam: 12/16/2020 Medical Rec #:  [id number]            Height:       67.0 in Accession #:    [id number]           Weight:       216.6 lb Date of Birth:  06-19-1950           BSA:          2.092 m Patient Age:    69 years             BP:           138/98 mmHg Patient Gender: M                    HR:           71 bpm. Exam Location:  [facility]  Procedure: 2D Echo and Intracardiac Opacification Agent  Indications:    Dyspnea on exertion [R06.00 (ICD-10-CM)]; Atypical chest pain [R07.89 (ICD-10-CM)]; NPH (normal pressure hydrocephalus) (HCC) [G91.2 (ICD-10-CM)]; Dyslipidemia [E78.5 (ICD-10-CM)]; Essential hypertension [I10 (ICD-10-CM)]; Chest pain, unspecified type [R07.9 (ICD-10-CM)]  History:        Patient has no prior history of Echocardiogram examinations.  Sonographer:    Louie Boston RDCS Referring Phys: [phone] Georgeanna Lea   Sonographer Comments: Suboptimal apical window. IMPRESSIONS   1. Left ventricular ejection fraction, by estimation, is 60 to 65%. The left ventricle has normal function. The left ventricle has no regional wall motion abnormalities. Left ventricular diastolic parameters are consistent with Grade I diastolic dysfunction (impaired  relaxation). 2. Right ventricular systolic function is normal. The right ventricular size is normal. There is normal pulmonary artery systolic pressure. 3. Left atrial size was mildly dilated. 4. The mitral valve is normal in structure. Mild mitral valve regurgitation. No evidence of mitral stenosis.  FINDINGS Left Ventricle: Left ventricular ejection fraction, by estimation, is 60 to 65%. The left ventricle has normal function. The left ventricle  has no regional wall motion abnormalities. Definity contrast agent was given IV to delineate the left ventricular endocardial borders. The left ventricular internal cavity size was normal in size. There is no left ventricular hypertrophy. Left ventricular diastolic parameters are consistent with Grade I diastolic dysfunction (impaired relaxation).  Right Ventricle: The right ventricular size is normal. No increase in right ventricular wall thickness. Right ventricular systolic function is normal. There is normal pulmonary artery systolic pressure. The tricuspid regurgitant velocity is 2.84 m/s, and with an assumed right atrial pressure of 3 mmHg, the estimated right ventricular systolic pressure is 35.3 mmHg.  Left Atrium: Left atrial size was mildly dilated.  Right Atrium: Right atrial size was normal in size.  Pericardium: There is no evidence of pericardial effusion.  Mitral Valve: The mitral valve is normal in structure. Mild mitral valve regurgitation. No evidence of mitral valve stenosis.  Tricuspid Valve: The tricuspid valve is normal in structure. Tricuspid valve regurgitation is mild . No evidence of tricuspid stenosis.  Aortic Valve: The aortic valve is normal in structure. Aortic valve regurgitation is not visualized. No aortic stenosis is present.  Pulmonic Valve: The pulmonic valve was normal in structure. Pulmonic valve regurgitation is not visualized. No evidence of pulmonic stenosis.  Aorta: The aortic root is normal in size and  structure.  Venous: The inferior vena cava is normal in size with greater than 50% respiratory variability, suggesting right atrial pressure of 3 mmHg.  IAS/Shunts: No atrial level shunt detected by color flow Doppler.   LEFT VENTRICLE PLAX 2D LVIDd:         5.00 cm  Diastology LVIDs:         3.10 cm  LV e' medial:    7.29 cm/s LV PW:         1.10 cm  LV E/e' medial:  11.8 LV IVS:        1.10 cm  LV e' lateral:   7.40 cm/s LVOT diam:     2.10 cm  LV E/e' lateral: 11.6 LV SV:         71 LV SV Index:   34 LVOT Area:     3.46 cm   RIGHT VENTRICLE            IVC RV S prime:     8.16 cm/s  IVC diam: 1.80 cm TAPSE (M-mode): 1.7 cm  LEFT ATRIUM             Index       RIGHT ATRIUM           Index LA diam:        4.20 cm 2.01 cm/m  RA Area:     11.30 cm LA Vol (A2C):   49.0 ml 23.42 ml/m RA Volume:   20.70 ml  9.90 ml/m LA Vol (A4C):   49.5 ml 23.66 ml/m LA Biplane Vol: 49.1 ml 23.47 ml/m AORTIC VALVE LVOT Vmax:   84.20 cm/s LVOT Vmean:  54.300 cm/s LVOT VTI:    0.204 m  AORTA Ao Root diam: 3.10 cm Ao Asc diam:  3.30 cm Ao Desc diam: 2.40 cm  MITRAL VALVE               TRICUSPID VALVE MV Area (PHT): 5.20 cm    TR Peak grad:   32.3 mmHg MV Decel Time: 146 msec    TR Vmax:        284.00 cm/s MV E velocity: 85.70 cm/s MV A velocity: 94.75 cm/s  SHUNTS MV E/A ratio:  0.90        Systemic VTI:  0.20 m Systemic Diam: 2.10 cm  Belva Crome MD Electronically signed by Belva Crome MD Signature Date/Time: 12/17/2020/5:00:27 PM    Final               EKG:  EKG is not ordered today.    Recent Labs: No results found for requested labs within last 365 days.  Recent Lipid Panel    Component Value Date/Time   CHOL 184 11/30/2021 0919   TRIG 294 (H) 11/30/2021 0919   HDL 31 (L) 11/30/2021 0919   CHOLHDL 5.9 (H) 11/30/2021 0919   LDLCALC 103 (H) 11/30/2021 0919     Risk Assessment/Calculations:      HYPERTENSION CONTROL Vitals:   09/06/22 1003 09/06/22  1053  BP: (!) 160/86 (!) 158/78    The patient's blood pressure is elevated above target today.  In order to address the patient's elevated BP: A current anti-hypertensive medication was adjusted today.            Physical Exam:    VS:  BP (!) 158/78   Pulse 72   Ht 5\' 7"  (1.702 m)   Wt 208 lb (94.3 kg)   SpO2 95%   BMI 32.58 kg/m     Wt Readings from Last 3 Encounters:  09/06/22 208 lb (94.3 kg)  09/02/22 207 lb (93.9 kg)  08/02/22 207 lb 6.4 oz (94.1 kg)     GEN:  Well nourished, well developed in no acute distress HEENT: Normal NECK: No JVD; No carotid bruits LYMPHATICS: No lymphadenopathy CARDIAC: RRR, no murmurs, rubs, gallops RESPIRATORY:  Clear to auscultation without rales, wheezing or rhonchi  ABDOMEN: Soft, non-tender, non-distended MUSCULOSKELETAL: Trace pedal edema at sock line; No deformity  SKIN: Warm and dry NEUROLOGIC:  Alert and oriented x 3 PSYCHIATRIC:  Normal affect   ASSESSMENT:    1. Essential hypertension   2. OSA (obstructive sleep apnea)   3. Dyspnea on exertion   4. Mixed hyperlipidemia    PLAN:    In order of problems listed above:  Hypertension-blood pressure is elevated in the office today 158/78, reports he takes it at home it is typically in the 140 range systolic.  Will increase his lisinopril to 5 mg daily.  Will repeat BMET today, and again in 3 weeks.  They will continue to check his blood pressure at home and let us know if it stays consistently elevated after increase of current lisinopril. DOE/fatigue-echo revealed normal EF, grade 1 DD.  He continues to have some DOE however he feels this is due to deconditioning.  Endorses some fatigue however is able to be very active on his property.  Will check CBC. Hyperlipidemia-most recent LDL was elevated at 103, triglycerides 294 on 11/30/2021, states this is currently managed by his PCP.  Continue Lipitor 10 mg daily. OSA-states he was evaluated for sleep apnea in the past however  upon reevaluation was told he does not have sleep apnea.  Disposition-increase lisinopril to 5 mg daily, BMET and CBC  today, again in 3 weeks.           Medication Adjustments/Labs and Tests Ordered: Current medicines are reviewed at length with the patient today.  Concerns regarding medicines are outlined above.  Orders Placed This Encounter  Procedures   CBC   Basic metabolic panel   Basic metabolic panel   Meds ordered this encounter  Medications   lisinopril (ZESTRIL) 5  MG tablet    Sig: Take 1 tablet (5 mg total) by mouth daily.    Dispense:  90 tablet    Refill:  3    Patient Instructions  Medication Instructions:   INCREASE: Lisinopril to 5mg  daily- You may double your current dose and your next refill will reflect your new dose.   Lab Work: [employer], CBC- today  Your physician recommends that you return for lab work in: 3 weeks You need to have labs done when you are fasting.  You can come Monday through Friday 8:30 am to 12:00 pm and 1:15 to 4:30. You do not need to make an appointment as the order has already been placed. The labs you are going to have done are BMET.    Testing/Procedures: None Ordered   Follow-Up: At [facility], you and your health needs are our priority.  As part of our continuing mission to provide you with exceptional heart care, we have created designated Provider Care Teams.  These Care Teams include your primary Cardiologist (physician) and Advanced Practice Providers (APPs -  Physician Assistants and Nurse Practitioners) who all work together to provide you with the care you need, when you need it.  We recommend signing up for the patient portal called "MyChart".  Sign up information is provided on this After Visit Summary.  MyChart is used to connect with patients for Virtual Visits (Telemedicine).  Patients are able to view lab/test results, encounter notes, upcoming appointments, etc.  Non-urgent messages can be sent to your provider  as well.   To learn more about what you can do with MyChart, go to [email or web address].    Your next appointment:   6 month(s)  The format for your next appointment:   In Person  Provider:   Wallis Bamberg, NP   Other Instructions NA    Signed, Flossie Dibble, NP  09/06/2022 10:58 AM    [facility]

## 2022-09-06 ENCOUNTER — Ambulatory Visit: Payer: Medicare Other | Attending: Cardiology | Admitting: Cardiology

## 2022-09-06 ENCOUNTER — Encounter: Payer: Self-pay | Admitting: Cardiology

## 2022-09-06 VITALS — BP 158/78 | HR 72 | Ht 67.0 in | Wt 208.0 lb

## 2022-09-06 DIAGNOSIS — G4733 Obstructive sleep apnea (adult) (pediatric): Secondary | ICD-10-CM

## 2022-09-06 DIAGNOSIS — R0609 Other forms of dyspnea: Secondary | ICD-10-CM | POA: Diagnosis not present

## 2022-09-06 DIAGNOSIS — I10 Essential (primary) hypertension: Secondary | ICD-10-CM | POA: Diagnosis not present

## 2022-09-06 DIAGNOSIS — E782 Mixed hyperlipidemia: Secondary | ICD-10-CM | POA: Diagnosis not present

## 2022-09-06 MED ORDER — LISINOPRIL 5 MG PO TABS: 5.0000 mg | ORAL_TABLET | Freq: Every day | ORAL | 3 refills | Status: AC

## 2022-09-06 NOTE — Patient Instructions (Addendum)
Medication Instructions:   INCREASE: Lisinopril to 5mg  daily- You may double your current dose and your next refill will reflect your new dose.   Lab Work: [employer], CBC- today  Your physician recommends that you return for lab work in: 3 weeks You need to have labs done when you are fasting.  You can come Monday through Friday 8:30 am to 12:00 pm and 1:15 to 4:30. You do not need to make an appointment as the order has already been placed. The labs you are going to have done are BMET.    Testing/Procedures: None Ordered   Follow-Up: At [facility], you and your health needs are our priority.  As part of our continuing mission to provide you with exceptional heart care, we have created designated Provider Care Teams.  These Care Teams include your primary Cardiologist (physician) and Advanced Practice Providers (APPs -  Physician Assistants and Nurse Practitioners) who all work together to provide you with the care you need, when you need it.  We recommend signing up for the patient portal called "MyChart".  Sign up information is provided on this After Visit Summary.  MyChart is used to connect with patients for Virtual Visits (Telemedicine).  Patients are able to view lab/test results, encounter notes, upcoming appointments, etc.  Non-urgent messages can be sent to your provider as well.   To learn more about what you can do with MyChart, go to [email or web address].    Your next appointment:   6 month(s)  The format for your next appointment:   In Person  Provider:   Wallis Bamberg, NP   Other Instructions NA

## 2022-09-07 LAB — CBC
Hematocrit: 45.2 % (ref 37.5–51.0)
Hemoglobin: 15.4 g/dL (ref 13.0–17.7)
MCH: 29.9 pg (ref 26.6–33.0)
MCHC: 34.1 g/dL (ref 31.5–35.7)
MCV: 88 fL (ref 79–97)
Platelets: 209 x10E3/uL (ref 150–450)
RBC: 5.15 x10E6/uL (ref 4.14–5.80)
RDW: 14.2 % (ref 11.6–15.4)
WBC: 7.3 x10E3/uL (ref 3.4–10.8)

## 2022-09-07 LAB — BASIC METABOLIC PANEL WITH GFR
BUN/Creatinine Ratio: 20 (ref 10–24)
BUN: 19 mg/dL (ref 8–27)
CO2: 22 mmol/L (ref 20–29)
Calcium: 9.2 mg/dL (ref 8.6–10.2)
Chloride: 105 mmol/L (ref 96–106)
Creatinine, Ser: 0.96 mg/dL (ref 0.76–1.27)
Glucose: 159 mg/dL — ABNORMAL HIGH (ref 70–99)
Potassium: 4.3 mmol/L (ref 3.5–5.2)
Sodium: 142 mmol/L (ref 134–144)
eGFR: 86 mL/min/1.73

## 2022-09-09 NOTE — Addendum Note (Signed)
Addended byDrake Leach on: 09/09/2022 03:21 PM   Modules accepted: Orders

## 2022-09-15 DIAGNOSIS — R0981 Nasal congestion: Secondary | ICD-10-CM | POA: Diagnosis not present

## 2022-09-15 DIAGNOSIS — J209 Acute bronchitis, unspecified: Secondary | ICD-10-CM | POA: Diagnosis not present

## 2022-09-20 DIAGNOSIS — M5136 Other intervertebral disc degeneration, lumbar region: Secondary | ICD-10-CM | POA: Diagnosis not present

## 2022-09-20 DIAGNOSIS — M5416 Radiculopathy, lumbar region: Secondary | ICD-10-CM | POA: Diagnosis not present

## 2022-09-22 DIAGNOSIS — R519 Headache, unspecified: Secondary | ICD-10-CM | POA: Diagnosis not present

## 2022-09-22 DIAGNOSIS — R0982 Postnasal drip: Secondary | ICD-10-CM | POA: Diagnosis not present

## 2022-09-22 DIAGNOSIS — J0100 Acute maxillary sinusitis, unspecified: Secondary | ICD-10-CM | POA: Diagnosis not present

## 2022-09-22 DIAGNOSIS — R0981 Nasal congestion: Secondary | ICD-10-CM | POA: Diagnosis not present

## 2022-09-27 DIAGNOSIS — I10 Essential (primary) hypertension: Secondary | ICD-10-CM | POA: Diagnosis not present

## 2022-09-28 LAB — BASIC METABOLIC PANEL WITH GFR
BUN/Creatinine Ratio: 24 (ref 10–24)
BUN: 28 mg/dL — ABNORMAL HIGH (ref 8–27)
CO2: 23 mmol/L (ref 20–29)
Calcium: 9.2 mg/dL (ref 8.6–10.2)
Chloride: 104 mmol/L (ref 96–106)
Creatinine, Ser: 1.19 mg/dL (ref 0.76–1.27)
Glucose: 163 mg/dL — ABNORMAL HIGH (ref 70–99)
Potassium: 4.6 mmol/L (ref 3.5–5.2)
Sodium: 143 mmol/L (ref 134–144)
eGFR: 66 mL/min/1.73
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
## 2022-10-21 ENCOUNTER — Encounter: Payer: Self-pay | Admitting: Orthopedic Surgery

## 2022-10-21 ENCOUNTER — Ambulatory Visit (INDEPENDENT_AMBULATORY_CARE_PROVIDER_SITE_OTHER): Payer: Medicare Other | Admitting: Orthopedic Surgery

## 2022-10-21 DIAGNOSIS — M4726 Other spondylosis with radiculopathy, lumbar region: Secondary | ICD-10-CM | POA: Diagnosis not present

## 2022-10-21 DIAGNOSIS — M47816 Spondylosis without myelopathy or radiculopathy, lumbar region: Secondary | ICD-10-CM

## 2022-10-21 DIAGNOSIS — M5136 Other intervertebral disc degeneration, lumbar region: Secondary | ICD-10-CM

## 2022-10-21 DIAGNOSIS — M5116 Intervertebral disc disorders with radiculopathy, lumbar region: Secondary | ICD-10-CM

## 2022-10-21 DIAGNOSIS — M5416 Radiculopathy, lumbar region: Secondary | ICD-10-CM

## 2022-10-25 DIAGNOSIS — S90561A Insect bite (nonvenomous), right ankle, initial encounter: Secondary | ICD-10-CM | POA: Diagnosis not present

## 2022-10-25 DIAGNOSIS — S50861A Insect bite (nonvenomous) of right forearm, initial encounter: Secondary | ICD-10-CM | POA: Diagnosis not present

## 2022-10-25 DIAGNOSIS — S80862A Insect bite (nonvenomous), left lower leg, initial encounter: Secondary | ICD-10-CM | POA: Diagnosis not present

## 2022-10-25 DIAGNOSIS — S30863A Insect bite (nonvenomous) of scrotum and testes, initial encounter: Secondary | ICD-10-CM | POA: Diagnosis not present

## 2022-10-28 DIAGNOSIS — M5416 Radiculopathy, lumbar region: Secondary | ICD-10-CM | POA: Diagnosis not present

## 2022-10-28 DIAGNOSIS — M5126 Other intervertebral disc displacement, lumbar region: Secondary | ICD-10-CM | POA: Diagnosis not present

## 2022-11-01 DIAGNOSIS — I10 Essential (primary) hypertension: Secondary | ICD-10-CM | POA: Diagnosis not present

## 2022-11-01 DIAGNOSIS — Z6831 Body mass index (BMI) 31.0-31.9, adult: Secondary | ICD-10-CM | POA: Diagnosis not present

## 2022-11-01 DIAGNOSIS — R6889 Other general symptoms and signs: Secondary | ICD-10-CM | POA: Diagnosis not present

## 2022-11-29 DIAGNOSIS — M7918 Myalgia, other site: Secondary | ICD-10-CM | POA: Diagnosis not present

## 2022-11-29 DIAGNOSIS — M5136 Other intervertebral disc degeneration, lumbar region: Secondary | ICD-10-CM | POA: Diagnosis not present

## 2022-11-29 DIAGNOSIS — M5416 Radiculopathy, lumbar region: Secondary | ICD-10-CM | POA: Diagnosis not present

## 2022-12-02 DIAGNOSIS — E861 Hypovolemia: Secondary | ICD-10-CM | POA: Diagnosis not present

## 2022-12-02 DIAGNOSIS — I10 Essential (primary) hypertension: Secondary | ICD-10-CM | POA: Diagnosis not present

## 2022-12-02 DIAGNOSIS — N179 Acute kidney failure, unspecified: Secondary | ICD-10-CM | POA: Diagnosis not present

## 2022-12-02 DIAGNOSIS — F419 Anxiety disorder, unspecified: Secondary | ICD-10-CM | POA: Diagnosis not present

## 2022-12-30 DIAGNOSIS — K219 Gastro-esophageal reflux disease without esophagitis: Secondary | ICD-10-CM | POA: Diagnosis not present

## 2022-12-30 DIAGNOSIS — Z125 Encounter for screening for malignant neoplasm of prostate: Secondary | ICD-10-CM | POA: Diagnosis not present

## 2022-12-30 DIAGNOSIS — Z23 Encounter for immunization: Secondary | ICD-10-CM | POA: Diagnosis not present

## 2022-12-30 DIAGNOSIS — Z79899 Other long term (current) drug therapy: Secondary | ICD-10-CM | POA: Diagnosis not present

## 2022-12-30 DIAGNOSIS — E782 Mixed hyperlipidemia: Secondary | ICD-10-CM | POA: Diagnosis not present

## 2022-12-30 DIAGNOSIS — F419 Anxiety disorder, unspecified: Secondary | ICD-10-CM | POA: Diagnosis not present

## 2022-12-30 DIAGNOSIS — I10 Essential (primary) hypertension: Secondary | ICD-10-CM | POA: Diagnosis not present

## 2022-12-30 DIAGNOSIS — E7439 Other disorders of intestinal carbohydrate absorption: Secondary | ICD-10-CM | POA: Diagnosis not present

## 2022-12-30 DIAGNOSIS — Z0000 Encounter for general adult medical examination without abnormal findings: Secondary | ICD-10-CM | POA: Diagnosis not present

## 2023-01-31 DIAGNOSIS — K219 Gastro-esophageal reflux disease without esophagitis: Secondary | ICD-10-CM | POA: Diagnosis not present

## 2023-01-31 DIAGNOSIS — Z6832 Body mass index (BMI) 32.0-32.9, adult: Secondary | ICD-10-CM | POA: Diagnosis not present

## 2023-02-20 DIAGNOSIS — K5900 Constipation, unspecified: Secondary | ICD-10-CM | POA: Diagnosis not present

## 2023-02-20 DIAGNOSIS — K589 Irritable bowel syndrome without diarrhea: Secondary | ICD-10-CM | POA: Diagnosis not present

## 2023-02-20 DIAGNOSIS — K219 Gastro-esophageal reflux disease without esophagitis: Secondary | ICD-10-CM | POA: Diagnosis not present

## 2023-04-14 DIAGNOSIS — Z6832 Body mass index (BMI) 32.0-32.9, adult: Secondary | ICD-10-CM | POA: Diagnosis not present

## 2023-04-14 DIAGNOSIS — K219 Gastro-esophageal reflux disease without esophagitis: Secondary | ICD-10-CM | POA: Diagnosis not present

## 2023-05-16 DIAGNOSIS — K219 Gastro-esophageal reflux disease without esophagitis: Secondary | ICD-10-CM | POA: Diagnosis not present

## 2023-05-16 DIAGNOSIS — Z6831 Body mass index (BMI) 31.0-31.9, adult: Secondary | ICD-10-CM | POA: Diagnosis not present

## 2023-05-16 DIAGNOSIS — Z79899 Other long term (current) drug therapy: Secondary | ICD-10-CM | POA: Diagnosis not present

## 2023-05-16 DIAGNOSIS — S99921A Unspecified injury of right foot, initial encounter: Secondary | ICD-10-CM | POA: Diagnosis not present

## 2023-05-16 DIAGNOSIS — R7302 Impaired glucose tolerance (oral): Secondary | ICD-10-CM | POA: Diagnosis not present

## 2023-05-19 DIAGNOSIS — S90211A Contusion of right great toe with damage to nail, initial encounter: Secondary | ICD-10-CM | POA: Diagnosis not present

## 2023-05-19 DIAGNOSIS — Z6832 Body mass index (BMI) 32.0-32.9, adult: Secondary | ICD-10-CM | POA: Diagnosis not present

## 2023-05-19 DIAGNOSIS — S92421D Displaced fracture of distal phalanx of right great toe, subsequent encounter for fracture with routine healing: Secondary | ICD-10-CM | POA: Diagnosis not present

## 2023-05-27 DIAGNOSIS — H1031 Unspecified acute conjunctivitis, right eye: Secondary | ICD-10-CM | POA: Diagnosis not present

## 2023-05-29 DIAGNOSIS — R0981 Nasal congestion: Secondary | ICD-10-CM | POA: Diagnosis not present

## 2023-05-29 DIAGNOSIS — H10023 Other mucopurulent conjunctivitis, bilateral: Secondary | ICD-10-CM | POA: Diagnosis not present

## 2023-05-30 DIAGNOSIS — S92401A Displaced unspecified fracture of right great toe, initial encounter for closed fracture: Secondary | ICD-10-CM | POA: Diagnosis not present

## 2023-06-06 DIAGNOSIS — S92424A Nondisplaced fracture of distal phalanx of right great toe, initial encounter for closed fracture: Secondary | ICD-10-CM | POA: Diagnosis not present

## 2023-06-21 DIAGNOSIS — J069 Acute upper respiratory infection, unspecified: Secondary | ICD-10-CM | POA: Diagnosis not present

## 2023-06-21 DIAGNOSIS — R0981 Nasal congestion: Secondary | ICD-10-CM | POA: Diagnosis not present

## 2023-06-22 ENCOUNTER — Encounter: Payer: Self-pay | Admitting: Pulmonary Disease

## 2023-06-23 ENCOUNTER — Other Ambulatory Visit (INDEPENDENT_AMBULATORY_CARE_PROVIDER_SITE_OTHER)

## 2023-06-23 ENCOUNTER — Encounter: Payer: Self-pay | Admitting: Gastroenterology

## 2023-06-23 ENCOUNTER — Ambulatory Visit (INDEPENDENT_AMBULATORY_CARE_PROVIDER_SITE_OTHER): Admitting: Gastroenterology

## 2023-06-23 VITALS — BP 130/74 | HR 76 | Ht 67.0 in | Wt 203.0 lb

## 2023-06-23 DIAGNOSIS — K219 Gastro-esophageal reflux disease without esophagitis: Secondary | ICD-10-CM | POA: Diagnosis not present

## 2023-06-23 DIAGNOSIS — R194 Change in bowel habit: Secondary | ICD-10-CM | POA: Diagnosis not present

## 2023-06-23 DIAGNOSIS — K529 Noninfective gastroenteritis and colitis, unspecified: Secondary | ICD-10-CM | POA: Diagnosis not present

## 2023-06-23 DIAGNOSIS — R141 Gas pain: Secondary | ICD-10-CM

## 2023-06-23 DIAGNOSIS — R152 Fecal urgency: Secondary | ICD-10-CM

## 2023-06-23 DIAGNOSIS — Z800 Family history of malignant neoplasm of digestive organs: Secondary | ICD-10-CM | POA: Diagnosis not present

## 2023-06-23 LAB — SEDIMENTATION RATE: Sed Rate: 3 mm/h (ref 0–20)

## 2023-06-23 LAB — COMPREHENSIVE METABOLIC PANEL
ALT: 21 U/L (ref 0–53)
AST: 12 U/L (ref 0–37)
Albumin: 4.6 g/dL (ref 3.5–5.2)
Alkaline Phosphatase: 59 U/L (ref 39–117)
BUN: 23 mg/dL (ref 6–23)
CO2: 24 meq/L (ref 19–32)
Calcium: 9.2 mg/dL (ref 8.4–10.5)
Chloride: 106 meq/L (ref 96–112)
Creatinine, Ser: 1.19 mg/dL (ref 0.40–1.50)
GFR: 62 mL/min (ref >60.00)
Glucose, Bld: 164 mg/dL — ABNORMAL HIGH (ref 70–99)
Potassium: 4.7 meq/L (ref 3.5–5.1)
Sodium: 140 meq/L (ref 135–145)
Total Bilirubin: 0.8 mg/dL (ref 0.2–1.2)
Total Protein: 7.4 g/dL (ref 6.0–8.3)

## 2023-06-23 LAB — CBC
HCT: 46.7 % (ref 39.0–52.0)
Hemoglobin: 15.9 g/dL (ref 13.0–17.0)
MCHC: 34 g/dL (ref 30.0–36.0)
MCV: 89.9 fl (ref 78.0–100.0)
Platelets: 222 10*3/uL (ref 150.0–400.0)
RBC: 5.2 Mil/uL (ref 4.22–5.81)
RDW: 13.9 % (ref 11.5–15.5)
WBC: 11.7 10*3/uL — ABNORMAL HIGH (ref 4.0–10.5)

## 2023-06-23 LAB — C-REACTIVE PROTEIN: CRP: <1 mg/dL (ref 0.5–20.0)

## 2023-06-23 LAB — TSH: TSH: 0.2 u[IU]/mL — ABNORMAL LOW (ref 0.35–5.50)

## 2023-06-23 MED ORDER — NA SULFATE-K SULFATE-MG SULF 17.5-3.13-1.6 GM/177ML PO SOLN: 1.0000 | ORAL | 0 refills | Status: DC

## 2023-06-23 NOTE — Progress Notes (Signed)
 GASTROENTEROLOGY OUTPATIENT CLINIC VISIT   Primary Care Provider Buckner Malta, MD [address] [place] [address] [phone]  Referring Provider Buckner Malta, MD [address] [place],  [place] [address] [phone]  Patient Profile: Ricky Perry is a 73 y.o. male with a pmh significant for NPH (status post CSF shunt), hypertension, arthritis, allergies, BPH, nephrolithiasis, GERD, family history esophagus cancer (father).  The patient presents to the [facility] for an evaluation and management of problem(s) noted below:  Problem List 1. Change in bowel habits   2. Fecal urgency   3. Chronic diarrhea   4. Gastroesophageal reflux disease, unspecified whether esophagitis present   5. Abdominal gas pain   6. Family history of malignant neoplasm of esophagus    Discussed the use of AI scribe software for clinical note transcription with the patient, who gave verbal consent to proceed.  History of Present Illness History of Present Illness This is the patient's first visit to the outpatient [facility].  He is accompanied by his wife.  Over the last 2-1/2 to 3 months he has been experiencing increased gas (flatulence/eructation) and diarrhea.  He has an urgency sometimes prevents him from reaching the restroom in time.  His diarrhea is sudden, occurring 'within two seconds' without apparent reason, no matter his clean eating.  A year ago, he had a normal formed bowel movement twice daily.  Currently, he experiences four to six loose bowel movements daily and they are not well-formed. No blood in the stool is noted.  He uses Metamucil on a daily basis powder, once daily.  He is also been taking milk of magnesia for a long period of time once daily with his Metamucil.  There is a new medication that has been started within the last 3 months, but they are not clear of the name of that medicine but we will look it up and let us know next  week.  He has not had any unintentional weight loss.  He has no nocturnal symptoms causing him to wake up at night to have a bowel movement.  He relays a previous colonoscopy within the last 5 to 6 years in [place] which was reported normal and told to follow-up in 10 years.  He has been told that he has GERD based on his pulmonologist putting him on GERD therapy.  Thankfully he does not have overt pyrosis or dysphagia symptoms.  He has been on GERD therapy for years.  He has never had an upper endoscopy.  GI Review of Systems Positive as above Negative for odynophagia, abdominal pain, early satiety, melena, hematochezia  Review of Systems General: Denies fevers/chills/weight loss unintentionally Cardiovascular: Denies chest pain Pulmonary: Denies shortness of breath Gastroenterological: See HPI Genitourinary: Denies darkened urine Hematological: Denies easy bruising/bleeding Endocrine: Denies temperature intolerance Dermatological: Denies jaundice Psychological: Mood is stable   Medications Current Outpatient Medications  Medication Sig Dispense Refill   amoxicillin (AMOXIL) 500 MG capsule Take 2,000 mg by mouth once. 1 hour prior to dental appointment     atorvastatin (LIPITOR) 10 MG tablet TAKE 1 TABLET BY MOUTH EVERY DAY 90 tablet 2   Azelastine HCl 137 MCG/SPRAY SOLN Place 2 sprays into both nostrils 2 (two) times daily.     citalopram (CELEXA) 20 MG tablet Take 20 mg by mouth daily.     fexofenadine (ALLEGRA) 180 MG tablet Take 180 mg by mouth daily.     gabapentin (NEURONTIN) 400 MG capsule Take 400 mg by  mouth 2 (two) times daily.     Na Sulfate-K Sulfate-Mg Sulfate concentrate (SUPREP BOWEL PREP KIT) 17.5-3.13-1.6 GM/177ML SOLN Take 1 kit (354 mLs total) by mouth as directed. For colonoscopy prep 354 mL 0   naproxen (NAPROSYN) 500 MG tablet Take 500 mg by mouth 2 (two) times daily as needed for pain.     Nutritional Supplements (KETO PO) Take 1 tablet by mouth daily.  Unknown strength     omeprazole (PRILOSEC) 40 MG capsule Take 40 mg by mouth daily.     tamsulosin (FLOMAX) 0.4 MG CAPS capsule Take 0.4 mg by mouth daily.     traZODone (DESYREL) 100 MG tablet Take 100 mg by mouth at bedtime.     lisinopril (ZESTRIL) 5 MG tablet Take 1 tablet (5 mg total) by mouth daily. 90 tablet 3   No current facility-administered medications for this visit.    Allergies Allergies  Allergen Reactions   Sulfamethoxazole-Trimethoprim Rash   Cephalosporins Hives   Oxycodone-Acetaminophen     Other reaction(s): Other (See Comments) Percocet-sleep walks    Tape Rash    Can use paper tape    Histories Past Medical History:  Diagnosis Date   Arthritis    DOE (dyspnea on exertion)    Essential hypertension    GERD (gastroesophageal reflux disease)    Headache    IBS (irritable bowel syndrome)    Kidney stone    Mild obstructive sleep apnea    NPH (normal pressure hydrocephalus) (HCC) 02/12/2018   Seasonal allergies    Past Surgical History:  Procedure Laterality Date   HAND SURGERY     NECK SURGERY     SHOULDER SURGERY Bilateral    VENTRICULOPERITONEAL SHUNT  02/13/2018   By Venetia Night at [facility]   Social History   Socioeconomic History   Marital status: Married    Spouse name: Not on file   Number of children: 0   Years of education: Not on file   Highest education level: Not on file  Occupational History   Occupation: retired  Tobacco Use   Smoking status: Never   Smokeless tobacco: Never  Vaping Use   Vaping status: Never Used  Substance and Sexual Activity   Alcohol use: Never   Drug use: Never   Sexual activity: Yes  Other Topics Concern   Not on file  Social History Narrative   Not on file   Social Drivers of Health   Financial Resource Strain: Not on file  Food Insecurity: Not on file  Transportation Needs: Not on file  Physical Activity: Not on file  Stress: Not on file  Social Connections: Not  on file  Intimate Partner Violence: Not on file   Family History  Problem Relation Age of Onset   Hypertension Mother    Lung cancer Father    Esophageal cancer Father    Liver disease Neg Hx    Colon cancer Neg Hx    Inflammatory bowel disease Neg Hx    Pancreatic cancer Neg Hx    Rectal cancer Neg Hx    Stomach cancer Neg Hx    I have reviewed his medical, social, and family history in detail and updated the electronic medical record as necessary.    PHYSICAL EXAMINATION  BP 130/74   Pulse 76   Ht 5\' 7"  (1.702 m)   Wt 203 lb (92.1 kg)   BMI 31.79 kg/m  Wt Readings from Last 3 Encounters:  06/23/23 203 lb (  92.1 kg)  09/06/22 208 lb (94.3 kg)  09/02/22 207 lb (93.9 kg)  GEN: NAD, appears stated age, doesn't appear chronically ill, accompanied by wife PSYCH: Cooperative, without pressured speech EYE: Conjunctivae pink, sclerae anicteric ENT: MMM CV: Nontachycardic RESP: No audible wheezing GI: NABS, soft, protuberant abdomen, rounded, nontender, without rebound or guarding GU: DRE deferred as patient agrees to move forward with colonoscopy MSK/EXT: No significant lower extremity edema SKIN: No jaundice NEURO:  Alert & Oriented x 3, no focal deficits   REVIEW OF DATA  I reviewed the following data at the time of this encounter:  GI Procedures and Studies  Reported normal colonoscopy within the last 5 to 6 years in [place]  Laboratory Studies  Reviewed those in epic  Imaging Studies  No relevant studies to review   ASSESSMENT  Mr. Meek is a 73 y.o. male with a pmh significant for NPH (status post CSF shunt), hypertension, arthritis, allergies, BPH, nephrolithiasis, GERD, family history of esophagus cancer (father).  The patient is seen today for evaluation and management of:  1. Change in bowel habits   2. Fecal urgency   3. Chronic diarrhea   4. Gastroesophageal reflux disease, unspecified whether esophagitis present   5. Abdominal gas pain   6.  Family history of malignant neoplasm of esophagus    The patient is hemodynamically stable.  Clinically, he is experiencing GI symptoms that require additional workup and evaluation.  Etiology potential/differential is quite large at this time.  Will rule out endocrinologic/metabolic etiologies with laboratories.  Will rule out infectious etiologies with stool studies.  Will try to rule out EPI as well.  Patient potentially at risk SIBO and may need to be considered in future.  Diagnostic colonoscopy makes sense for rule out microscopic/collagenous colitis as well as IBD.  Upper endoscopy in setting of longstanding GERD to ensure no evidence of Barrett's esophagus, even though he is well-controlled does make sense.  Will pursue both of these procedures together.  IBS/D empiric Xifaxan therapy could be considered as well but we need to rule these things out first.  The risks and benefits of endoscopic evaluation were discussed with the patient; these include but are not limited to the risk of perforation, infection, bleeding, missed lesions, lack of diagnosis, severe illness requiring hospitalization, as well as anesthesia and sedation related illnesses.  The patient and/or family is agreeable to proceed.  All patient questions were answered to the best of my ability, and the patient agrees to the aforementioned plan of action with follow-up as indicated.   PLAN  Laboratories as outlined below Stool studies as outlined below Proceed with scheduling diagnostic colonoscopy with biopsies Proceed with scheduling screening endoscopy for at risk individual for Barrett's esophagus Increase Metamucil to 2-3 times use daily Stop milk of magnesia use Consider SIBO breath testing in future Consider IBS/D treatment empirically in future   Orders Placed This Encounter  Procedures   Stool Culture   Ova and parasite examination   CALPROTECTIN   CBC   Comp Met (CMET)   TSH   Sedimentation rate   C-reactive  protein   IgA   Tissue transglutaminase, IgA   Pancreatic elastase, fecal   Fecal fat, qualitative   Ambulatory referral to Gastroenterology    New Prescriptions   NA SULFATE-K SULFATE-MG SULFATE CONCENTRATE (SUPREP BOWEL PREP KIT) 17.5-3.13-1.6 GM/177ML SOLN    Take 1 kit (354 mLs total) by mouth as directed. For colonoscopy prep   Modified Medications   No  medications on file    Planned Follow Up No follow-ups on file.   Total Time in Face-to-Face and in Coordination of Care for patient including independent/personal interpretation/review of prior testing, medical history, examination, medication adjustment, communicating results with the patient directly, and documentation within the EHR is 45 minutes.Corliss Parish, MD [facility] Advanced Endoscopy Office # [phone]

## 2023-06-23 NOTE — Progress Notes (Unsigned)
 Marland Kitchen

## 2023-06-23 NOTE — Patient Instructions (Signed)
 We have sent the following medications to your pharmacy for you to pick up at your convenience: Suprep   Your provider has requested that you go to the basement level for lab work before leaving today. Press "B" on the elevator. The lab is located at the first door on the left as you exit the elevator.  Stop Milk of Magnesia  You have been scheduled for an endoscopy and colonoscopy. Please follow the written instructions given to you at your visit today.  If you use inhalers (even only as needed), please bring them with you on the day of your procedure.  DO NOT TAKE 7 DAYS PRIOR TO TEST- Trulicity (dulaglutide) Ozempic, Wegovy (semaglutide) Mounjaro (tirzepatide) Bydureon Bcise (exanatide extended release)  DO NOT TAKE 1 DAY PRIOR TO YOUR TEST Rybelsus (semaglutide) Adlyxin (lixisenatide) Victoza (liraglutide) Byetta (exanatide) ________________________________________________________   Increase Metamucil to 2-3 times daily.    Thank you for choosing me and [facility].  Dr. Meridee Score

## 2023-06-24 ENCOUNTER — Encounter: Payer: Self-pay | Admitting: Gastroenterology

## 2023-06-24 DIAGNOSIS — R152 Fecal urgency: Secondary | ICD-10-CM | POA: Insufficient documentation

## 2023-06-24 DIAGNOSIS — K529 Noninfective gastroenteritis and colitis, unspecified: Secondary | ICD-10-CM | POA: Insufficient documentation

## 2023-06-24 DIAGNOSIS — K219 Gastro-esophageal reflux disease without esophagitis: Secondary | ICD-10-CM | POA: Insufficient documentation

## 2023-06-24 DIAGNOSIS — R194 Change in bowel habit: Secondary | ICD-10-CM | POA: Insufficient documentation

## 2023-06-24 DIAGNOSIS — Z800 Family history of malignant neoplasm of digestive organs: Secondary | ICD-10-CM | POA: Insufficient documentation

## 2023-06-24 DIAGNOSIS — R141 Gas pain: Secondary | ICD-10-CM | POA: Insufficient documentation

## 2023-06-24 LAB — IGA: Immunoglobulin A: 120 mg/dL (ref 70–320)

## 2023-06-24 LAB — TISSUE TRANSGLUTAMINASE, IGA: (tTG) Ab, IgA: <1 U/mL

## 2023-06-27 DIAGNOSIS — K219 Gastro-esophageal reflux disease without esophagitis: Secondary | ICD-10-CM | POA: Diagnosis not present

## 2023-06-27 DIAGNOSIS — Z1339 Encounter for screening examination for other mental health and behavioral disorders: Secondary | ICD-10-CM | POA: Diagnosis not present

## 2023-06-27 DIAGNOSIS — Z6831 Body mass index (BMI) 31.0-31.9, adult: Secondary | ICD-10-CM | POA: Diagnosis not present

## 2023-06-27 DIAGNOSIS — K5900 Constipation, unspecified: Secondary | ICD-10-CM | POA: Diagnosis not present

## 2023-06-28 ENCOUNTER — Other Ambulatory Visit: Payer: Self-pay

## 2023-06-28 DIAGNOSIS — E0590 Thyrotoxicosis, unspecified without thyrotoxic crisis or storm: Secondary | ICD-10-CM

## 2023-06-28 NOTE — Progress Notes (Signed)
 Marland Kitchen

## 2023-06-29 ENCOUNTER — Other Ambulatory Visit: Payer: Self-pay

## 2023-06-29 ENCOUNTER — Telehealth: Payer: Self-pay | Admitting: Gastroenterology

## 2023-06-29 DIAGNOSIS — R7989 Other specified abnormal findings of blood chemistry: Secondary | ICD-10-CM

## 2023-06-29 DIAGNOSIS — K529 Noninfective gastroenteritis and colitis, unspecified: Secondary | ICD-10-CM

## 2023-06-29 DIAGNOSIS — E038 Other specified hypothyroidism: Secondary | ICD-10-CM

## 2023-06-29 NOTE — Telephone Encounter (Signed)
 Inbound call fro patient requesting to have labs draw at [facility] in [place]. Please advise.   Thank you

## 2023-06-29 NOTE — Addendum Note (Signed)
 Addended by: Heber Creston A on: 06/29/2023 03:42 PM   Modules accepted: Orders

## 2023-06-29 NOTE — Telephone Encounter (Signed)
 Spoke with the spouse of the patient. The patient wants to have his follow up thyroid labs drawn at the PCP office. Confirmed with PCP office that this could be done. Faxed order requisitions to [phone] (office) and [phone] (lab). Patient's spouse notified.

## 2023-06-30 DIAGNOSIS — E038 Other specified hypothyroidism: Secondary | ICD-10-CM | POA: Diagnosis not present

## 2023-06-30 LAB — LAB REPORT - SCANNED: Free T4: 0.98 ng/dL

## 2023-07-09 DIAGNOSIS — R0981 Nasal congestion: Secondary | ICD-10-CM | POA: Diagnosis not present

## 2023-07-09 DIAGNOSIS — R070 Pain in throat: Secondary | ICD-10-CM | POA: Diagnosis not present

## 2023-07-11 DIAGNOSIS — S92424A Nondisplaced fracture of distal phalanx of right great toe, initial encounter for closed fracture: Secondary | ICD-10-CM | POA: Diagnosis not present

## 2023-07-16 DIAGNOSIS — R051 Acute cough: Secondary | ICD-10-CM | POA: Diagnosis not present

## 2023-07-16 DIAGNOSIS — K591 Functional diarrhea: Secondary | ICD-10-CM | POA: Diagnosis not present

## 2023-07-16 DIAGNOSIS — R0981 Nasal congestion: Secondary | ICD-10-CM | POA: Diagnosis not present

## 2023-07-16 DIAGNOSIS — M791 Myalgia, unspecified site: Secondary | ICD-10-CM | POA: Diagnosis not present

## 2023-07-25 ENCOUNTER — Other Ambulatory Visit

## 2023-07-25 DIAGNOSIS — R152 Fecal urgency: Secondary | ICD-10-CM | POA: Diagnosis not present

## 2023-07-25 DIAGNOSIS — K529 Noninfective gastroenteritis and colitis, unspecified: Secondary | ICD-10-CM | POA: Diagnosis not present

## 2023-07-25 DIAGNOSIS — R194 Change in bowel habit: Secondary | ICD-10-CM | POA: Diagnosis not present

## 2023-07-25 DIAGNOSIS — K219 Gastro-esophageal reflux disease without esophagitis: Secondary | ICD-10-CM | POA: Diagnosis not present

## 2023-07-27 LAB — SALMONELLA/SHIGELLA CULT, CAMPY EIA AND SHIGA TOXIN RFL ECOLI
MICRO NUMBER: 16359341
MICRO NUMBER:: 16359342
MICRO NUMBER:: 16359343
Result:: NOT DETECTED
SHIGA RESULT:: NOT DETECTED
SPECIMEN QUALITY: ADEQUATE
SPECIMEN QUALITY:: ADEQUATE
SPECIMEN QUALITY:: ADEQUATE

## 2023-07-28 LAB — FECAL FAT, QUALITATIVE
Fat Qual Neutral, Stl: INCREASED
Fat Qual Total, Stl: INCREASED

## 2023-07-28 LAB — OVA AND PARASITE EXAMINATION
CONCENTRATE RESULT:: NONE SEEN
MICRO NUMBER:: 16359344
SPECIMEN QUALITY:: ADEQUATE
TRICHROME RESULT:: NONE SEEN

## 2023-07-30 LAB — PANCREATIC ELASTASE, FECAL: Pancreatic Elastase-1, Stool: >800 ug/g (ref >200)

## 2023-08-01 LAB — CALPROTECTIN: Calprotectin: 50 ug/g

## 2023-08-03 ENCOUNTER — Other Ambulatory Visit: Payer: Self-pay

## 2023-08-03 DIAGNOSIS — K529 Noninfective gastroenteritis and colitis, unspecified: Secondary | ICD-10-CM

## 2023-08-03 DIAGNOSIS — R152 Fecal urgency: Secondary | ICD-10-CM

## 2023-08-11 ENCOUNTER — Ambulatory Visit (AMBULATORY_SURGERY_CENTER): Admitting: Gastroenterology

## 2023-08-11 ENCOUNTER — Encounter: Payer: Self-pay | Admitting: Gastroenterology

## 2023-08-11 VITALS — BP 85/67 | HR 69 | Temp 97.9°F | Resp 11 | Ht 67.0 in | Wt 203.0 lb

## 2023-08-11 DIAGNOSIS — D120 Benign neoplasm of cecum: Secondary | ICD-10-CM

## 2023-08-11 DIAGNOSIS — D123 Benign neoplasm of transverse colon: Secondary | ICD-10-CM

## 2023-08-11 DIAGNOSIS — K3189 Other diseases of stomach and duodenum: Secondary | ICD-10-CM

## 2023-08-11 DIAGNOSIS — K562 Volvulus: Secondary | ICD-10-CM | POA: Diagnosis not present

## 2023-08-11 DIAGNOSIS — D122 Benign neoplasm of ascending colon: Secondary | ICD-10-CM

## 2023-08-11 DIAGNOSIS — I10 Essential (primary) hypertension: Secondary | ICD-10-CM | POA: Diagnosis not present

## 2023-08-11 DIAGNOSIS — K641 Second degree hemorrhoids: Secondary | ICD-10-CM

## 2023-08-11 DIAGNOSIS — K317 Polyp of stomach and duodenum: Secondary | ICD-10-CM | POA: Diagnosis not present

## 2023-08-11 DIAGNOSIS — G4733 Obstructive sleep apnea (adult) (pediatric): Secondary | ICD-10-CM | POA: Diagnosis not present

## 2023-08-11 DIAGNOSIS — K644 Residual hemorrhoidal skin tags: Secondary | ICD-10-CM | POA: Diagnosis not present

## 2023-08-11 DIAGNOSIS — K219 Gastro-esophageal reflux disease without esophagitis: Secondary | ICD-10-CM | POA: Diagnosis not present

## 2023-08-11 DIAGNOSIS — R194 Change in bowel habit: Secondary | ICD-10-CM

## 2023-08-11 DIAGNOSIS — K2970 Gastritis, unspecified, without bleeding: Secondary | ICD-10-CM | POA: Diagnosis not present

## 2023-08-11 MED ORDER — SODIUM CHLORIDE 0.9 % IV SOLN: 500.0000 mL | Freq: Once | INTRAVENOUS | Status: DC

## 2023-08-11 NOTE — Op Note (Signed)
  Endoscopy Center Patient Name: Ricky Perry Procedure Date: 08/11/2023 1:28 PM MRN: [id number] Endoscopist: Yong Henle , MD, [id number] Age: 73 Referring MD:  Date of Birth: November 20, 1950 Gender: Male Account #: [id number] Procedure:                Colonoscopy Indications:              Change in bowel habits Medicines:                Monitored Anesthesia Care Procedure:                Pre-Anesthesia Assessment:                           - Prior to the procedure, a History and Physical                            was performed, and patient medications and                            allergies were reviewed. The patient's tolerance of                            previous anesthesia was also reviewed. The risks                            and benefits of the procedure and the sedation                            options and risks were discussed with the patient.                            All questions were answered, and informed consent                            was obtained. Prior Anticoagulants: The patient has                            taken no anticoagulant or antiplatelet agents. ASA                            Grade Assessment: III - A patient with severe                            systemic disease. After reviewing the risks and                            benefits, the patient was deemed in satisfactory                            condition to undergo the procedure.                           After obtaining informed consent, the colonoscope  was passed under direct vision. Throughout the                            procedure, the patient's blood pressure, pulse, and                            oxygen saturations were monitored continuously. The                            CF [id number] #[id number] was introduced through the anus                            and advanced to the 3 cm into the ileum. The                            colonoscopy was performed  without difficulty. The                            patient tolerated the procedure. The quality of the                            bowel preparation was adequate. The terminal ileum,                            ileocecal valve, appendiceal orifice, and rectum                            were photographed. Scope In: 1:41:13 PM Scope Out: 2:07:41 PM Scope Withdrawal Time: 0 hours 24 minutes 21 seconds  Total Procedure Duration: 0 hours 26 minutes 28 seconds  Findings:                 Skin tags were found on perianal exam.                           The digital rectal exam findings include                            hemorrhoids. Pertinent negatives include no                            palpable rectal lesions.                           The colon (entire examined portion) revealed                            significantly excessive looping.                           15 sessile polyps were found in the transverse                            colon (5), ascending colon (7) and cecum (3). The  polyps were 2 to 10 mm in size. These polyps were                            removed with a cold snare. Resection and retrieval                            were complete.                           Normal mucosa was found in the entire colon.                            Biopsies for histology were taken with a cold                            forceps from the entire colon for evaluation of                            microscopic colitis.                           Normal mucosa was found in the rectum. Biopsies                            were taken with a cold forceps for histology to                            rule out proctitis.                           Non-bleeding non-thrombosed external and internal                            hemorrhoids were found during retroflexion, during                            perianal exam and during digital exam. The                            hemorrhoids  were Grade II (internal hemorrhoids                            that prolapse but reduce spontaneously). Complications:            No immediate complications. Estimated Blood Loss:     Estimated blood loss was minimal. Impression:               - Perianal skin tags found on perianal exam.                            Hemorrhoids found on digital rectal exam.                           - There was significant looping of the colon.                           -  15, 2 to 10 mm polyps in the transverse colon, in                            the ascending colon and in the cecum, removed with                            a cold snare. Resected and retrieved.                           - Normal mucosa in the entire examined colon.                            Biopsied.                           - Normal mucosa in the rectum. Biopsied.                           - Non-bleeding non-thrombosed external and internal                            hemorrhoids. Recommendation:           - The patient will be observed post-procedure,                            until all discharge criteria are met.                           - Discharge patient to home.                           - Patient has a contact number available for                            emergencies. The signs and symptoms of potential                            delayed complications were discussed with the                            patient. Return to normal activities tomorrow.                            Written discharge instructions were provided to the                            patient.                           - High fiber diet.                           - Use FiberCon 1-2 tablets PO daily.                           -  Continue present medications.                           - Await pathology results.                           - Repeat colonoscopy in 1 year for surveillance of                            multiple polyps.                           -  Further workup of bowel habit changes based on                            pathology. SIBO breath testing should be considered.                           - The findings and recommendations were discussed                            with the patient.                           - The findings and recommendations were discussed                            with the patient's family. Yong Henle, MD 08/11/2023 2:20:45 PM

## 2023-08-11 NOTE — Patient Instructions (Addendum)
 - Await pathology results. - Continue current PPI dosing. - High fiber diet. - Use FiberCon 1-2 tablets PO daily. - Continue present medications. - Await pathology results. - Repeat colonoscopy in 1 year for surveillance of multiple polyps. - Further workup of bowel habit changes based on pathology. SIBO breath testing should be considered.  YOU HAD AN ENDOSCOPIC PROCEDURE TODAY AT THE [facility]:   Refer to the procedure report that was given to you for any specific questions about what was found during the examination.  If the procedure report does not answer your questions, please call your gastroenterologist to clarify.  If you requested that your care partner not be given the details of your procedure findings, then the procedure report has been included in a sealed envelope for you to review at your convenience later.  YOU SHOULD EXPECT: Some feelings of bloating in the abdomen. Passage of more gas than usual.  Walking can help get rid of the air that was put into your GI tract during the procedure and reduce the bloating. If you had a lower endoscopy (such as a colonoscopy or flexible sigmoidoscopy) you may notice spotting of blood in your stool or on the toilet paper. If you underwent a bowel prep for your procedure, you may not have a normal bowel movement for a few days.  Please Note:  You might notice some irritation and congestion in your nose or some drainage.  This is from the oxygen used during your procedure.  There is no need for concern and it should clear up in a day or so.  SYMPTOMS TO REPORT IMMEDIATELY:  Following lower endoscopy (colonoscopy or flexible sigmoidoscopy):  Excessive amounts of blood in the stool  Significant tenderness or worsening of abdominal pains  Swelling of the abdomen that is new, acute  Fever of 100F or higher  Following upper endoscopy (EGD)  Vomiting of blood or coffee ground material  New chest pain or pain under the shoulder  blades  Painful or persistently difficult swallowing  New shortness of breath  Fever of 100F or higher  Black, tarry-looking stools  For urgent or emergent issues, a gastroenterologist can be reached at any hour by calling [phone]. Do not use MyChart messaging for urgent concerns.    DIET:  We do recommend a small meal at first, but then you may proceed to your regular diet.  Drink plenty of fluids but you should avoid alcoholic beverages for 24 hours.  ACTIVITY:  You should plan to take it easy for the rest of today and you should NOT DRIVE or use heavy machinery until tomorrow (because of the sedation medicines used during the test).    FOLLOW UP: Our staff will call the number listed on your records the next business day following your procedure.  We will call around 7:15- 8:00 am to check on you and address any questions or concerns that you may have regarding the information given to you following your procedure. If we do not reach you, we will leave a message.     If any biopsies were taken you will be contacted by phone or by letter within the next 1-3 weeks.  Please call us  at [phone] if you have not heard about the biopsies in 3 weeks.    SIGNATURES/CONFIDENTIALITY: You and/or your care partner have signed paperwork which will be entered into your electronic medical record.  These signatures attest to the fact that that the information above on your  After Visit Summary has been reviewed and is understood.  Full responsibility of the confidentiality of this discharge information lies with you and/or your care-partner.

## 2023-08-11 NOTE — Progress Notes (Signed)
 Report given to PACU, vss

## 2023-08-11 NOTE — Op Note (Signed)
 [name] Patient Name: Ricky Perry Procedure Date: 08/11/2023 1:28 PM MRN: [id number] Endoscopist: Yong Henle , MD, [id number] Age: 73 Referring MD:  Date of Birth: 1950/05/27 Gender: Male Account #: [id number] Procedure:                Upper GI endoscopy Indications:              Follow-up of gastro-esophageal reflux disease,                            Change in bowel habits Medicines:                Monitored Anesthesia Care Procedure:                Pre-Anesthesia Assessment:                           - Prior to the procedure, a History and Physical                            was performed, and patient medications and                            allergies were reviewed. The patient's tolerance of                            previous anesthesia was also reviewed. The risks                            and benefits of the procedure and the sedation                            options and risks were discussed with the patient.                            All questions were answered, and informed consent                            was obtained. Prior Anticoagulants: The patient has                            taken no anticoagulant or antiplatelet agents. ASA                            Grade Assessment: III - A patient with severe                            systemic disease. After reviewing the risks and                            benefits, the patient was deemed in satisfactory                            condition to undergo the procedure.  After obtaining informed consent, the endoscope was                            passed under direct vision. Throughout the                            procedure, the patient's blood pressure, pulse, and                            oxygen saturations were monitored continuously. The                            Olympus Scope [id number] was introduced through the                            mouth, and advanced  to the second part of duodenum.                            The upper GI endoscopy was accomplished without                            difficulty. The patient tolerated the procedure. Scope In: Scope Out: Findings:                 No gross lesions were noted in the entire esophagus.                           The Z-line was regular and was found 41 cm from the                            incisors.                           Multiple small semi-sessile polyps were found in                            the gastric body (consistent with fundic gland                            polyps).                           Patchy mildly erythematous mucosa without bleeding                            was found in the entire examined stomach. Biopsies                            were taken with a cold forceps for histology and                            Helicobacter pylori testing.                           No gross lesions were noted in  the duodenal bulb,                            in the first portion of the duodenum and in the                            second portion of the duodenum. Biopsies were taken                            with a cold forceps for histology. Complications:            No immediate complications. Estimated Blood Loss:     Estimated blood loss was minimal. Impression:               - No gross lesions in the entire esophagus. Z-line                            regular, 41 cm from the incisors.                           - Multiple fundic gland polyps.                           - Erythematous mucosa in the stomach. Biopsied.                           - No gross lesions in the duodenal bulb, in the                            first portion of the duodenum and in the second                            portion of the duodenum. Biopsied. Recommendation:           - Proceed to scheduled colonoscopy.                           - Observe patient's clinical course.                           - Await  pathology results.                           - Continue current PPI dosing.                           - The findings and recommendations were discussed                            with the patient.                           - The findings and recommendations were discussed                            with the patient's family. Yong Henle, MD 08/11/2023  2:16:35 PM

## 2023-08-11 NOTE — Progress Notes (Signed)
1315 Robinul 0.1 mg IV given due large amount of secretions upon assessment.  MD made aware, vss  

## 2023-08-11 NOTE — Progress Notes (Signed)
 GASTROENTEROLOGY PROCEDURE H&P NOTE   Primary Care Physician: Harvest Lineman, MD  HPI: Ricky Perry is a 73 y.o. male who presents for EGD/Colonoscopy for change in bowel habits and GERD evaluation.  Past Medical History:  Diagnosis Date   Arthritis    DOE (dyspnea on exertion)    Essential hypertension    GERD (gastroesophageal reflux disease)    Headache    IBS (irritable bowel syndrome)    Kidney stone    Mild obstructive sleep apnea    NPH (normal pressure hydrocephalus) (HCC) 02/12/2018   Seasonal allergies    Past Surgical History:  Procedure Laterality Date   HAND SURGERY     NECK SURGERY     SHOULDER SURGERY Bilateral    VENTRICULOPERITONEAL SHUNT  02/13/2018   By Jodeen Munch at [facility]   Current Outpatient Medications  Medication Sig Dispense Refill   atorvastatin  (LIPITOR) 10 MG tablet TAKE 1 TABLET BY MOUTH EVERY DAY 90 tablet 2   citalopram (CELEXA) 20 MG tablet Take 20 mg by mouth daily.     fexofenadine (ALLEGRA) 180 MG tablet Take 180 mg by mouth daily.     gabapentin (NEURONTIN) 400 MG capsule Take 400 mg by mouth 2 (two) times daily.     lisinopril  (ZESTRIL ) 5 MG tablet Take 1 tablet (5 mg total) by mouth daily. 90 tablet 3   omeprazole (PRILOSEC) 40 MG capsule Take 40 mg by mouth daily.     tamsulosin (FLOMAX) 0.4 MG CAPS capsule Take 0.4 mg by mouth daily.     traZODone (DESYREL) 100 MG tablet Take 100 mg by mouth at bedtime.     amoxicillin (AMOXIL) 500 MG capsule Take 2,000 mg by mouth once. 1 hour prior to dental appointment     Azelastine HCl 137 MCG/SPRAY SOLN Place 2 sprays into both nostrils 2 (two) times daily.     naproxen (NAPROSYN) 500 MG tablet Take 500 mg by mouth 2 (two) times daily as needed for pain. (Patient not taking: Reported on 08/11/2023)     Nutritional Supplements (KETO PO) Take 1 tablet by mouth daily. Unknown strength     Current Facility-Administered Medications  Medication Dose Route  Frequency Provider Last Rate Last Admin   0.9 %  sodium chloride infusion  500 mL Intravenous Once Mansouraty, Zitlaly Malson Jr., MD        Current Outpatient Medications:    atorvastatin  (LIPITOR) 10 MG tablet, TAKE 1 TABLET BY MOUTH EVERY DAY, Disp: 90 tablet, Rfl: 2   citalopram (CELEXA) 20 MG tablet, Take 20 mg by mouth daily., Disp: , Rfl:    fexofenadine (ALLEGRA) 180 MG tablet, Take 180 mg by mouth daily., Disp: , Rfl:    gabapentin (NEURONTIN) 400 MG capsule, Take 400 mg by mouth 2 (two) times daily., Disp: , Rfl:    lisinopril  (ZESTRIL ) 5 MG tablet, Take 1 tablet (5 mg total) by mouth daily., Disp: 90 tablet, Rfl: 3   omeprazole (PRILOSEC) 40 MG capsule, Take 40 mg by mouth daily., Disp: , Rfl:    tamsulosin (FLOMAX) 0.4 MG CAPS capsule, Take 0.4 mg by mouth daily., Disp: , Rfl:    traZODone (DESYREL) 100 MG tablet, Take 100 mg by mouth at bedtime., Disp: , Rfl:    amoxicillin (AMOXIL) 500 MG capsule, Take 2,000 mg by mouth once. 1 hour prior to dental appointment, Disp: , Rfl:    Azelastine HCl 137 MCG/SPRAY SOLN, Place 2 sprays into both nostrils 2 (two) times  daily., Disp: , Rfl:    naproxen (NAPROSYN) 500 MG tablet, Take 500 mg by mouth 2 (two) times daily as needed for pain. (Patient not taking: Reported on 08/11/2023), Disp: , Rfl:    Nutritional Supplements (KETO PO), Take 1 tablet by mouth daily. Unknown strength, Disp: , Rfl:   Current Facility-Administered Medications:    0.9 %  sodium chloride infusion, 500 mL, Intravenous, Once, Mansouraty, Albino Alu., MD Allergies  Allergen Reactions   Sulfamethoxazole-Trimethoprim Rash   Cephalosporins Hives   Oxycodone-Acetaminophen     Other reaction(s): Other (See Comments) Percocet-sleep walks    Tape Rash    Can use paper tape   Family History  Problem Relation Age of Onset   Hypertension Mother    Lung cancer Father    Esophageal cancer Father    Liver disease Neg Hx    Colon cancer Neg Hx    Inflammatory bowel disease Neg  Hx    Pancreatic cancer Neg Hx    Rectal cancer Neg Hx    Stomach cancer Neg Hx    Social History   Socioeconomic History   Marital status: Married    Spouse name: Not on file   Number of children: 0   Years of education: Not on file   Highest education level: Not on file  Occupational History   Occupation: retired  Tobacco Use   Smoking status: Never   Smokeless tobacco: Never  Vaping Use   Vaping status: Never Used  Substance and Sexual Activity   Alcohol use: Never   Drug use: Never   Sexual activity: Yes  Other Topics Concern   Not on file  Social History Narrative   Not on file   Social Drivers of Health   Financial Resource Strain: Not on file  Food Insecurity: Not on file  Transportation Needs: Not on file  Physical Activity: Not on file  Stress: Not on file  Social Connections: Not on file  Intimate Partner Violence: Not on file    Physical Exam: Today's Vitals   08/11/23 1251 08/11/23 1252 08/11/23 1323  BP: 122/68  (!) 151/97  Pulse: 70  68  Resp:   18  Temp: 97.9 F (36.6 C) 97.9 F (36.6 C)   TempSrc: Temporal    SpO2: 96%  97%  Weight: 203 lb (92.1 kg)    Height: 5\' 7"  (1.702 m)     Body mass index is 31.79 kg/m. GEN: NAD EYE: Sclerae anicteric ENT: MMM CV: Non-tachycardic GI: Soft, NT/ND NEURO:  Alert & Oriented x 3  Lab Results: No results for input(s): "WBC", "HGB", "HCT", "PLT" in the last 72 hours. BMET No results for input(s): "NA", "K", "CL", "CO2", "GLUCOSE", "BUN", "CREATININE", "CALCIUM " in the last 72 hours. LFT No results for input(s): "PROT", "ALBUMIN", "AST", "ALT", "ALKPHOS", "BILITOT", "BILIDIR", "IBILI" in the last 72 hours. PT/INR No results for input(s): "LABPROT", "INR" in the last 72 hours.   Impression / Plan: This is a 73 y.o.male who presents for EGD/Colonoscopy for change in bowel habits and GERD evaluation.  The risks and benefits of endoscopic evaluation/treatment were discussed with the patient and/or  family; these include but are not limited to the risk of perforation, infection, bleeding, missed lesions, lack of diagnosis, severe illness requiring hospitalization, as well as anesthesia and sedation related illnesses.  The patient's history has been reviewed, patient examined, no change in status, and deemed stable for procedure.  The patient and/or family is agreeable to proceed.  Yong Henle, MD [facility] Advanced [facility]oscopy Office # [phone]

## 2023-08-14 ENCOUNTER — Telehealth: Payer: Self-pay

## 2023-08-14 ENCOUNTER — Telehealth: Payer: Self-pay | Admitting: Gastroenterology

## 2023-08-14 NOTE — Telephone Encounter (Signed)
 Patient wife called and stated that he husband is requesting to speak to either Dr. Brice Campi or his nurse. Patient wife stated that her husband can be reached from 12-1 PM for lunch.Please advise.

## 2023-08-14 NOTE — Telephone Encounter (Signed)
 Megan, Thank you for letting me know. Hopefully he starts feeling better today.  Would recommend he take Gas-X extra strength 3 times daily or Phazyme extra strength.   Patty, Can you please reach out to the patient's wife and let her know this recommendation and follow-up tomorrow? If the patient's symptoms are much more significant or progressing/worsening, then I need to know and he may need imaging and laboratory to be performed (CBC/CMP/KUB 2 view/chest x-ray two-view). Thanks. GM

## 2023-08-14 NOTE — Telephone Encounter (Signed)
  Follow up Call-     08/11/2023   12:52 PM  Call back number  Post procedure Call Back phone  # [phone]  Permission to leave phone message Yes     Patient questions:  Do you have a fever, pain , or abdominal swelling? Yes.   Pain Score  Patients wife answered and said that her husband was at work but that he was in pain. She assumed from taking out 15 polyps but said he was eating. I explained that a polypectomy usually does not cause pain but that he could have retained air. I did ask her to let him know that if his pain is not improving by today to give us  a call. She said she would and had no further questions.  Have you tolerated food without any problems? Yes.    Have you been able to return to your normal activities? Yes.    Do you have any questions about your discharge instructions: Diet   No. Medications  No. Follow up visit  No.  Do you have questions or concerns about your Care? No.  Actions: * If pain score is 4 or above: No action needed, pain <4.

## 2023-08-14 NOTE — Telephone Encounter (Signed)
 Spoke with the pt wife and she has been advised to have the pt follow the gas ex recommendations. She will have him call tomorrow with an update.

## 2023-08-15 NOTE — Telephone Encounter (Signed)
 The pt states that he is better and has some slight "burning" in the abd.  He will call back if he does not continue to improve.

## 2023-08-15 NOTE — Telephone Encounter (Signed)
 Glad to hear he is doing better. GM

## 2023-08-16 LAB — SURGICAL PATHOLOGY

## 2023-08-20 ENCOUNTER — Ambulatory Visit: Payer: Self-pay | Admitting: Gastroenterology

## 2023-09-26 DIAGNOSIS — I10 Essential (primary) hypertension: Secondary | ICD-10-CM | POA: Diagnosis not present

## 2023-09-26 DIAGNOSIS — K219 Gastro-esophageal reflux disease without esophagitis: Secondary | ICD-10-CM | POA: Diagnosis not present

## 2023-09-26 DIAGNOSIS — D126 Benign neoplasm of colon, unspecified: Secondary | ICD-10-CM | POA: Diagnosis not present

## 2023-09-26 DIAGNOSIS — E782 Mixed hyperlipidemia: Secondary | ICD-10-CM | POA: Diagnosis not present

## 2023-09-27 ENCOUNTER — Telehealth: Payer: Self-pay | Admitting: Gastroenterology

## 2023-09-27 DIAGNOSIS — Z6830 Body mass index (BMI) 30.0-30.9, adult: Secondary | ICD-10-CM | POA: Diagnosis not present

## 2023-09-27 DIAGNOSIS — R197 Diarrhea, unspecified: Secondary | ICD-10-CM | POA: Diagnosis not present

## 2023-09-27 DIAGNOSIS — R109 Unspecified abdominal pain: Secondary | ICD-10-CM | POA: Diagnosis not present

## 2023-09-27 NOTE — Telephone Encounter (Signed)
 Procedures and path was sent to the PCP as requested

## 2023-09-27 NOTE — Telephone Encounter (Signed)
 Patient PCP office called and stated that they would like the patient office note of patient procedure that occurred on the 18 th of May. Please advise.

## 2023-10-03 DIAGNOSIS — Z6830 Body mass index (BMI) 30.0-30.9, adult: Secondary | ICD-10-CM | POA: Diagnosis not present

## 2023-10-03 DIAGNOSIS — R222 Localized swelling, mass and lump, trunk: Secondary | ICD-10-CM | POA: Diagnosis not present

## 2023-10-03 DIAGNOSIS — W57XXXA Bitten or stung by nonvenomous insect and other nonvenomous arthropods, initial encounter: Secondary | ICD-10-CM | POA: Diagnosis not present

## 2023-10-16 DIAGNOSIS — R21 Rash and other nonspecific skin eruption: Secondary | ICD-10-CM | POA: Diagnosis not present

## 2023-10-16 DIAGNOSIS — L299 Pruritus, unspecified: Secondary | ICD-10-CM | POA: Diagnosis not present

## 2023-11-10 ENCOUNTER — Ambulatory Visit (INDEPENDENT_AMBULATORY_CARE_PROVIDER_SITE_OTHER): Admitting: Gastroenterology

## 2023-11-10 ENCOUNTER — Encounter: Payer: Self-pay | Admitting: Gastroenterology

## 2023-11-10 VITALS — BP 130/78 | HR 78 | Ht 67.0 in | Wt 195.0 lb

## 2023-11-10 DIAGNOSIS — R140 Abdominal distension (gaseous): Secondary | ICD-10-CM | POA: Diagnosis not present

## 2023-11-10 DIAGNOSIS — R152 Fecal urgency: Secondary | ICD-10-CM | POA: Diagnosis not present

## 2023-11-10 DIAGNOSIS — R159 Full incontinence of feces: Secondary | ICD-10-CM

## 2023-11-10 DIAGNOSIS — K529 Noninfective gastroenteritis and colitis, unspecified: Secondary | ICD-10-CM | POA: Diagnosis not present

## 2023-11-10 DIAGNOSIS — R7989 Other specified abnormal findings of blood chemistry: Secondary | ICD-10-CM

## 2023-11-10 DIAGNOSIS — Z860101 Personal history of adenomatous and serrated colon polyps: Secondary | ICD-10-CM | POA: Diagnosis not present

## 2023-11-10 NOTE — Patient Instructions (Addendum)
 _______________________________________________________  If your blood pressure at your visit was 140/90 or greater, please contact your primary care physician to follow up on this.  _______________________________________________________  If you are age 71 or older, your body mass index should be between 23-30. Your Body mass index is 30.54 kg/m. If this is out of the aforementioned range listed, please consider follow up with your Primary Care Provider.  If you are age 71 or younger, your body mass index should be between 19-25. Your Body mass index is 30.54 kg/m. If this is out of the aformentioned range listed, please consider follow up with your Primary Care Provider.   ________________________________________________________  The [facility] providers would like to encourage you to use MYCHART to communicate with providers for non-urgent requests or questions.  Due to long hold times on the telephone, sending your provider a message by [facility] may be a faster and more efficient way to get a response.  Please allow 48 business hours for a response.  Please remember that this is for non-urgent requests.  _______________________________________________________  [facility] is using a team-based approach to care.  Your team is made up of your doctor and two to three APPS. Our APPS (Nurse Practitioners and Physician Assistants) work with your physician to ensure care continuity for you. They are fully qualified to address your health concerns and develop a treatment plan. They communicate directly with your gastroenterologist to care for you. Seeing the Advanced Practice Practitioners on your physician's team can help you by facilitating care more promptly, often allowing for earlier appointments, access to diagnostic testing, procedures, and other specialty referrals.    You have been scheduled for an appointment with Dr. Wilhelmenia on 01-17-24 at 11:10 am. Please arrive 10 minutes  early for your appointment.  Please purchase the following medications over the counter and take as directed: Fiber supplement. Can do fiber con 1-2 tablets daily  Please do a high fiber diet. Continue imodium and gas-x as needed.  Please follow up with your PCP regarding your thyroid .  You have been given a testing kit to check for small intestine bacterial overgrowth (SIBO) which is completed by a company named [facility]. Make sure to return your test in the mail using the return mailing label given to you along with the kit. The test order, your demographic and insurance information have all already been sent to the company. [facility] will collect an upfront charge of $109.00 for commercial insurance plans and $229.00 if you are paying cash. The potential remaining total after claim submission and review is $120.00. Make sure to discuss with [facility] PRIOR to having the test to see if they have gotten information from your insurance company as to how much your testing will cost out of pocket, if any. Please contact [facility] at phone number [phone] to get instructions regarding how to perform the test as our office is unable to give specific testing instructions.   High-Fiber Eating Plan Fiber, also called dietary fiber, is found in foods such as fruits, vegetables, whole grains, and beans. A high-fiber diet can be good for your health. Your health care provider may recommend a high-fiber diet to help: Prevent trouble pooping (constipation). Lower your cholesterol. Treat the following conditions: Hemorrhoids. This is inflammation of veins in the anus. Inflammation of specific areas of the digestive tract. Irritable bowel syndrome (IBS). This is a problem of the large intestine, also called the colon, that sometimes causes belly pain and bloating. Prevent overeating as part of a  weight-loss plan. Lower the risk of heart disease, type 2 diabetes, and certain  cancers. What are tips for following this plan? Reading food labels  Check the nutrition facts label on foods for the amount of dietary fiber. Choose foods that have 4 grams of fiber or more per serving. The recommended goals for how much fiber you should eat each day include: Males 24 years old or younger: 30-34 g. Males over 71 years old: 28-34 g. Females 56 years old or younger: 25-28 g. Females over 71 years old: 22-25 g. Your daily fiber goal is _____________ g. Shopping Choose whole fruits and vegetables instead of processed. For example, choose apples instead of apple juice or applesauce. Choose a variety of high-fiber foods such as avocados, lentils, oats, and pinto beans. Read the nutrition facts label on foods. Check for foods with added fiber. These foods often have high sugar and salt (sodium) amounts per serving. Cooking Use whole-grain flour for baking and cooking. Cook with brown rice instead of white rice. Make meals that have a lot of beans and vegetables in them, such as chili or vegetable-based soups. Meal planning Start the day with a breakfast that is high in fiber, such as a cereal that has 5 g of fiber or more per serving. Eat breads and cereals that are made with whole-grain flour instead of refined flour or white flour. Eat brown rice, bulgur wheat, or millet instead of white rice. Use beans in place of meat in soups, salads, and pasta dishes. Be sure that half of the grains you eat each day are whole grains. General information You can get the recommended amount of dietary fiber by: Eating a variety of fruits, vegetables, grains, nuts, and beans. Taking a fiber supplement if you aren't able to eat enough fiber. It's better to get fiber through food than from a supplement. Slowly increase how much fiber you eat. If you increase the amount of fiber you eat too quickly, you may have bloating, cramping, or gas. Drink plenty of water to help you digest  fiber. Choose high-fiber snacks, such as berries, raw vegetables, nuts, and popcorn. What foods should I eat? Fruits Berries. Pears. Apples. Oranges. Avocado. Prunes and raisins. Dried figs. Vegetables Sweet potatoes. Spinach. Kale. Artichokes. Cabbage. Broccoli. Cauliflower. Green peas. Carrots. Squash. Grains Whole-grain breads. Multigrain cereal. Oats and oatmeal. Brown rice. Barley. Bulgur wheat. Millet. Quinoa. Bran muffins. Popcorn. Rye wafer crackers. Meats and other proteins Navy beans, kidney beans, and pinto beans. Soybeans. Split peas. Lentils. Nuts and seeds. Dairy Fiber-fortified yogurt. Fortified means that fiber has been added to the product. Beverages Fiber-fortified soy milk. Fiber-fortified orange juice. Other foods Fiber bars. The items listed above may not be all the foods and drinks you can have. Talk to a dietitian to learn more. What foods should I avoid? Fruits Fruit juice. Cooked, strained fruit. Vegetables Fried potatoes. Canned vegetables. Well-cooked vegetables. Grains White bread. Pasta made with refined flour. White rice. Meats and other proteins Fatty meat. Fried chicken or fried fish. Dairy Milk. Cream cheese. Sour cream. Fats and oils Butters. Beverages Soft drinks. Other foods Cakes and pastries. The items listed above may not be all the foods and drinks you should avoid. Talk to a dietitian to learn more. This information is not intended to replace advice given to you by your health care provider. Make sure you discuss any questions you have with your health care provider. Document Revised: 06/13/2022 Document Reviewed: 06/13/2022 Elsevier Patient Education  2024 [employer].

## 2023-11-10 NOTE — Progress Notes (Signed)
 [name] [id number] 08/09/1950   Chief Complaint: Diarrhea, bloating  Referring Provider: Clemmie Nest, MD Primary GI MD: Dr. Wilhelmenia  HPI: Ricky Perry is a 73 y.o. male with past medical history of arthritis, HTN, GERD, IBS, kidney stones, OSA, normal pressure hydrocephalus s/p VP shunt, family history of esophageal cancer (father) who presents today for a complaint of bloating and diarrhea.    Patient initially seen in office 06/23/2023 for multiple GI symptoms including chronic diarrhea, change in bowel habits, fecal urgency, abdominal gas, GERD, family history of esophageal cancer.  He was scheduled for EGD and colonoscopy, advised to increase Metamucil to 2-3 times daily, with consideration for SIBO breath test in future or empiric treatment of IBS-D.  Labs 06/23/2023 showed normal inflammatory markers, negative for celiac disease, slight elevation in WBC with no evidence of anemia, normal liver function.  He was found to have a low TSH and further thyroid  function tests were ordered.  Stool testing negative for parasites, increased fecal fat, normal fecal elastase, normal pancreatic elastase, borderline fecal calprotectin.  Repeat recommended in 2 months.  He underwent EGD/colonoscopy 08/11/2023 with multiple adenomatous polyps found and recommended recall 1 year.  Consideration for SIBO testing in future recommended.   Patient here today with his wife.  States he continues to have diarrhea daily.  Stools are loose and gooey but not watery.  Wife describes diarrheal episodes as explosive and patient often has significant fecal urgency and almost no time to get to a bathroom, leading to frequent incontinence.  This has significantly limited their lifestyle as patient is unable to leave the house without Depends and risks having episodes of incontinence anytime they leave the house.  He is taking Imodium and Gas-X daily as needed.  Has not been taking any  fiber supplements.  Gas-X sometimes helps with bloating but not always.  He denies any blood in his stool or melena.  Denies fever, chills, nausea, vomiting, abdominal pain.  Typically has 1 bowel movement daily.  Denies any rectal pain.  Does have some lower abdominal fullness but not pain which is relieved with a bowel movement.  Patient drinks milk every day.  States that milk seems to help with diarrhea.  Has not tried eliminating lactose from his diet.  Overall has had diarrhea for about a year or 2 at this point.  Denies further workup of his abnormal TSH.  Previous GI Procedures/Imaging   Colonoscopy 08/11/2023 - Perianal skin tags found on perianal exam. Hemorrhoids found on digital rectal exam.  - There was significant looping of the colon. - 15, 2 to 10 mm polyps in the transverse colon, in the ascending colon and in the cecum, removed with a cold snare. Resected and retrieved.  - Normal mucosa in the entire examined colon. Biopsied.  - Normal mucosa in the rectum. Biopsied.  - Non- bleeding non- thrombosed external and internal hemorrhoids. - Recall 1 year for surveillance of multiple polyps Path: 3. Surgical [P], colon, cecum polyp x 3, ascending polyp x 7, transverse polyp x 5, polyp (15) :      - TUBULAR ADENOMA(S)      - NEGATIVE FOR HIGH-GRADE DYSPLASIA OR MALIGNANCY       4. Surgical [P], random colon biopsy sites :      - COLONIC MUCOSA WITH NO SPECIFIC HISTOPATHOLOGIC CHANGES      - NEGATIVE FOR ACUTE INFLAMMATION, INCREASED INTRAEPITHELIAL LYMPHOCYTES OR      THICKENED SUBEPITHELIAL COLLAGEN TABLE  5. Surgical [P], colon, rectum biopsy :      - RECTAL MUCOSA WITH NO SPECIFIC HISTOPATHOLOGIC CHANGES      - NEGATIVE FOR ACUTE INFLAMMATION, INCREASED INTRAEPITHELIAL LYMPHOCYTES OR      THICKENED SUBEPITHELIAL COLLAGEN TABLE   EGD 08/11/2023 - No gross lesions in the entire esophagus. Z- line regular, 41 cm from the incisors.  - Multiple fundic gland polyps.   - Erythematous mucosa in the stomach. Biopsied.  - No gross lesions in the duodenal bulb, in the first portion of the duodenum and in the second portion of the duodenum. Biopsied. Path: 1. Surgical [P], duodenal biopsy :       - DUODENAL MUCOSA WITH NO SPECIFIC HISTOPATHOLOGIC CHANGES       - NEGATIVE FOR INCREASED INTRAEPITHELIAL LYMPHOCYTES OR VILLOUS ARCHITECTURAL       CHANGES        2. Surgical [P], gastric biopsy :       - GASTRIC ANTRAL MUCOSA WITH MILD NONSPECIFIC REACTIVE GASTROPATHY       - GASTRIC OXYNTIC MUCOSA WITH PARIETAL CELL HYPERPLASIA AS CAN BE SEEN IN       HYPERGASTRINEMIC STATES SUCH AS PPI THERAPY.       - HELICOBACTER PYLORI-LIKE ORGANISMS ARE NOT IDENTIFIED ON ROUTINE H&E STAIN   Past Medical History:  Diagnosis Date   Arthritis    DOE (dyspnea on exertion)    Essential hypertension    GERD (gastroesophageal reflux disease)    Headache    IBS (irritable bowel syndrome)    Kidney stone    Mild obstructive sleep apnea    NPH (normal pressure hydrocephalus) (HCC) 02/12/2018   Seasonal allergies     Past Surgical History:  Procedure Laterality Date   HAND SURGERY     NECK SURGERY     SHOULDER SURGERY Bilateral    VENTRICULOPERITONEAL SHUNT  02/13/2018   By Reeves [facility] at [facility]    Current Outpatient Medications  Medication Sig Dispense Refill   amoxicillin (AMOXIL) 500 MG capsule Take 2,000 mg by mouth once. 1 hour prior to dental appointment     atorvastatin  (LIPITOR) 10 MG tablet TAKE 1 TABLET BY MOUTH EVERY DAY 90 tablet 2   Azelastine HCl 137 MCG/SPRAY SOLN Place 2 sprays into both nostrils 2 (two) times daily.     citalopram (CELEXA) 20 MG tablet Take 20 mg by mouth daily.     fexofenadine (ALLEGRA) 180 MG tablet Take 180 mg by mouth daily.     gabapentin (NEURONTIN) 400 MG capsule Take 400 mg by mouth 2 (two) times daily.     lisinopril  (ZESTRIL ) 5 MG tablet Take 1 tablet (5 mg total) by mouth daily. 90 tablet 3    naproxen (NAPROSYN) 500 MG tablet Take 500 mg by mouth 2 (two) times daily as needed for pain. (Patient not taking: Reported on 08/11/2023)     Nutritional Supplements (KETO PO) Take 1 tablet by mouth daily. Unknown strength     omeprazole (PRILOSEC) 40 MG capsule Take 40 mg by mouth daily.     tamsulosin (FLOMAX) 0.4 MG CAPS capsule Take 0.4 mg by mouth daily.     traZODone (DESYREL) 100 MG tablet Take 100 mg by mouth at bedtime.     No current facility-administered medications for this visit.    Allergies as of 11/10/2023 - Review Complete 08/11/2023  Allergen Reaction Noted   Sulfamethoxazole-trimethoprim Rash 03/30/2007   Cephalosporins Hives 03/06/2015   Oxycodone-acetaminophen  10/11/2007  Tape Rash 11/18/2020    Family History  Problem Relation Age of Onset   Hypertension Mother    Lung cancer Father    Esophageal cancer Father    Liver disease Neg Hx    Colon cancer Neg Hx    Inflammatory bowel disease Neg Hx    Pancreatic cancer Neg Hx    Rectal cancer Neg Hx    Stomach cancer Neg Hx     Social History   Tobacco Use   Smoking status: Never   Smokeless tobacco: Never  Vaping Use   Vaping status: Never Used  Substance Use Topics   Alcohol use: Never   Drug use: Never     Review of Systems:    Constitutional: No fever, chills, weakness or fatigue Cardiovascular: No chest pain, chest pressure or palpitations   Respiratory: No SOB or cough Gastrointestinal: See HPI and otherwise negative Hematologic: No bleeding or bruising    Physical Exam:  Vital signs: BP 130/78   Pulse 78   Ht 5' 7 (1.702 m)   Wt 195 lb (88.5 kg)   BMI 30.54 kg/m    Wt Readings from Last 3 Encounters:  11/10/23 195 lb (88.5 kg)  08/11/23 203 lb (92.1 kg)  06/23/23 203 lb (92.1 kg)     Constitutional: Pleasant, overweight male in NAD, alert and cooperative Head:  Normocephalic and atraumatic.  Eyes: No scleral icterus.  Respiratory: Respirations even and unlabored. Lungs  clear to auscultation bilaterally.  No wheezes, crackles, or rhonchi.  Cardiovascular:  Regular rate and rhythm. No murmurs. No peripheral edema. Gastrointestinal:  Soft, mildly distended, nontender. No rebound or guarding. Normal bowel sounds. No appreciable masses or hepatomegaly. Rectal:  Not performed.  Neurologic:  Alert and oriented x4;  grossly normal neurologically.  Skin:   Dry and intact without significant lesions or rashes. Psychiatric: Oriented to person, place and time. Demonstrates good judgement and reason without abnormal affect or behaviors.   RELEVANT LABS AND IMAGING: CBC    Component Value Date/Time   WBC 11.7 (H) 06/23/2023 1637   RBC 5.20 06/23/2023 1637   HGB 15.9 06/23/2023 1637   HGB 15.4 09/06/2022 1103   HCT 46.7 06/23/2023 1637   HCT 45.2 09/06/2022 1103   PLT 222.0 06/23/2023 1637   PLT 209 09/06/2022 1103   MCV 89.9 06/23/2023 1637   MCV 88 09/06/2022 1103   MCH 29.9 09/06/2022 1103   MCHC 34.0 06/23/2023 1637   RDW 13.9 06/23/2023 1637   RDW 14.2 09/06/2022 1103    CMP     Component Value Date/Time   NA 140 06/23/2023 1637   NA 143 09/27/2022 1133   K 4.7 06/23/2023 1637   CL 106 06/23/2023 1637   CO2 24 06/23/2023 1637   GLUCOSE 164 (H) 06/23/2023 1637   BUN 23 06/23/2023 1637   BUN 28 (H) 09/27/2022 1133   CREATININE 1.19 06/23/2023 1637   CALCIUM  9.2 06/23/2023 1637   PROT 7.4 06/23/2023 1637   ALBUMIN 4.6 06/23/2023 1637   AST 12 06/23/2023 1637   ALT 21 06/23/2023 1637   ALKPHOS 59 06/23/2023 1637   BILITOT 0.8 06/23/2023 1637   Echocardiogram 12/16/2020 1. Left ventricular ejection fraction, by estimation, is 60 to 65% . The left ventricle has normal function. The left ventricle has no regional wall motion abnormalities. Left ventricular diastolic parameters are consistent with Grade I diastolic dysfunction ( impaired relaxation) .  2. Right ventricular systolic function is normal. The right ventricular size is  normal. There is  normal pulmonary artery systolic pressure.  3. Left atrial size was mildly dilated.  4. The mitral valve is normal in structure. Mild mitral valve regurgitation. No evidence of mitral stenosis.  Assessment/Plan:   Chronic diarrhea Fecal urgency and incontinence Abdominal bloating Abnormal TSH Patient seen today in follow-up of diarrhea and abdominal bloating. Recently underwent EGD/colonoscopy with multiple adenomatous polyps found and recommended recall in 1 year.  Has been taking Imodium and Gas-X as needed but has not started a fiber supplement as was recommended following colonoscopy.  Was found to have a low TSH on labs at last visit but has not followed up with PCP or had further testing for this.  - Advised patient to follow high-fiber diet and add FiberCon 1 to 2 tablets daily - Will order SIBO breath test - Can continue Imodium and Gas-X as needed for diarrhea and bloating - Advised patient to follow-up with PCP regarding thyroid  function.  Possible this is contributing to his diarrhea. - Discussed trial of dairy elimination/low FODMAP diet.  History of adenomatous colon polyps Patient with multiple adenomatous polyps found on recent colonoscopy, with recall recommended in 1 year.  He is on the recall list.   Camie Furbish, PA-C [facility] 11/10/2023, 7:53 AM  Patient Care Team: Clemmie Nest, MD as PCP - General (Family Medicine) Bernie Lamar PARAS, MD as PCP - Cardiology (Cardiology) Kara Dorn NOVAK, MD as Consulting Physician (Pulmonary Disease)

## 2023-11-12 ENCOUNTER — Encounter: Payer: Self-pay | Admitting: Gastroenterology

## 2023-11-14 DIAGNOSIS — E038 Other specified hypothyroidism: Secondary | ICD-10-CM | POA: Diagnosis not present

## 2023-11-14 NOTE — Progress Notes (Signed)
 Attending Physician's Attestation   I have reviewed the chart.   I agree with the Advanced Practitioner's note, impression, and recommendations with any updates as below. Moving forward with SIBO breath testing makes sense.  Pending results, consideration of Xifaxan therapy for IBS-diarrhea can also be made.  Hopefully fiber supplementation will be helpful for him as well.   Aloha Finner, MD [facility] Advanced Endoscopy Office # [phone]

## 2023-12-07 DIAGNOSIS — M7592 Shoulder lesion, unspecified, left shoulder: Secondary | ICD-10-CM | POA: Diagnosis not present

## 2024-01-17 ENCOUNTER — Ambulatory Visit: Admitting: Gastroenterology

## 2024-01-19 ENCOUNTER — Ambulatory Visit (INDEPENDENT_AMBULATORY_CARE_PROVIDER_SITE_OTHER): Admitting: Gastroenterology

## 2024-01-19 ENCOUNTER — Encounter: Payer: Self-pay | Admitting: Gastroenterology

## 2024-01-19 VITALS — BP 110/70 | HR 76 | Ht 65.5 in | Wt 199.0 lb

## 2024-01-19 DIAGNOSIS — Z860101 Personal history of adenomatous and serrated colon polyps: Secondary | ICD-10-CM | POA: Diagnosis not present

## 2024-01-19 DIAGNOSIS — R140 Abdominal distension (gaseous): Secondary | ICD-10-CM | POA: Insufficient documentation

## 2024-01-19 DIAGNOSIS — R194 Change in bowel habit: Secondary | ICD-10-CM | POA: Diagnosis not present

## 2024-01-19 DIAGNOSIS — R141 Gas pain: Secondary | ICD-10-CM | POA: Diagnosis not present

## 2024-01-19 NOTE — Patient Instructions (Addendum)
 Please purchase the following medications over the counter and take as directed: Gas-x and can increase as necessary. If you would rather take something once a day then another option is Phazyme.   You have been given a testing kit to check for small intestine bacterial overgrowth (SIBO) which is completed by a company named [facility]. Make sure to return your test in the mail using the return mailing label given to you along with the kit. The test order, your demographic and insurance information have all already been sent to the company. [facility] will collect an upfront charge of $109.00 for commercial insurance plans and $229.00 if you are paying cash. The potential remaining total after claim submission and review is $120.00. Make sure to discuss with [facility] PRIOR to having the test to see if they have gotten information from your insurance company as to how much your testing will cost out of pocket, if any. Please contact [facility] at phone number [phone] to get instructions regarding how to perform the test as our office is unable to give specific testing instructions.  _______________________________________________________  If your blood pressure at your visit was 140/90 or greater, please contact your primary care physician to follow up on this.  _______________________________________________________  If you are age 71 or older, your body mass index should be between 23-30. Your Body mass index is 32.61 kg/m. If this is out of the aforementioned range listed, please consider follow up with your Primary Care Provider.  If you are age 71 or younger, your body mass index should be between 19-25. Your Body mass index is 32.61 kg/m. If this is out of the aformentioned range listed, please consider follow up with your Primary Care Provider.   ________________________________________________________  The [facility] providers would like to encourage you to use  MYCHART to communicate with providers for non-urgent requests or questions.  Due to long hold times on the telephone, sending your provider a message by [facility] may be a faster and more efficient way to get a response.  Please allow 48 business hours for a response.  Please remember that this is for non-urgent requests.  _______________________________________________________  [facility] is using a team-based approach to care.  Your team is made up of your doctor and two to three APPS. Our APPS (Nurse Practitioners and Physician Assistants) work with your physician to ensure care continuity for you. They are fully qualified to address your health concerns and develop a treatment plan. They communicate directly with your gastroenterologist to care for you. Seeing the Advanced Practice Practitioners on your physician's team can help you by facilitating care more promptly, often allowing for earlier appointments, access to diagnostic testing, procedures, and other specialty referrals.

## 2024-01-19 NOTE — Progress Notes (Signed)
 GASTROENTEROLOGY OUTPATIENT CLINIC VISIT   Primary Care Provider Clemmie Nest, MD [address] [place] [address] [phone]   Patient Profile: Ricky Perry is a 73 y.o. male with a pmh significant for NPH (status post CSF shunt), hypertension, arthritis, allergies, BPH, nephrolithiasis, GERD, colon polyps (TA's), family history esophagus cancer (father).  The patient presents to the [facility] for an evaluation and management of problem(s) noted below:  Problem List 1. Change in bowel habits   2. Bloating   3. Abdominal gas pain   4. Hx of adenomatous colonic polyps    Discussed the use of AI scribe software for clinical note transcription with the patient, who gave verbal consent to proceed.  History of Present Illness Please see prior notes for full details of HPI.  Interval History Ricky Perry is a 73 year old male who presents for follow-up with and is accompanied by his wife.  He is doing better at this time.  HIs previous diarrheal issues have changed.  Currently bowel movements occur every two to three days, which is less frequent than before.  He has increased his fiber intake. No blood is observed in the stool.  He experiences significant gas and bloating, however.  We had wanted him to do a SIBO breath test after recent clinic, but he did not complete this (it is in his car currently).  He uses Gas-X once daily, which he finds somewhat helpful but not completely.  He knows that he needs repeat colonoscopy due to his number of TAs that were found during his recent colonoscopy this year.  Otherwise, he and wife are happy where things are.  He has seven acres of land where he plans to go hunting this weekend (they both consume deer meat more often for health benefits).   GI Review of Systems Positive as above Negative for dysphagia, odynophagia, pain, melena, hematochezia   Review of Systems General: Denies  fevers/chills/weight loss unintentionally Cardiovascular: Denies chest pain Pulmonary: Denies shortness of breath Gastroenterological: See HPI Genitourinary: Denies darkened urine Hematological: Denies easy bruising/bleeding Dermatological: Denies jaundice Psychological: Mood is stable   Medications Current Outpatient Medications  Medication Sig Dispense Refill   atorvastatin  (LIPITOR) 10 MG tablet TAKE 1 TABLET BY MOUTH EVERY DAY 90 tablet 2   Azelastine HCl 137 MCG/SPRAY SOLN Place 2 sprays into both nostrils 2 (two) times daily.     citalopram (CELEXA) 20 MG tablet Take 20 mg by mouth daily.     famotidine (PEPCID) 20 MG tablet Take 20 mg by mouth 2 (two) times daily.     fexofenadine (ALLEGRA) 180 MG tablet Take 180 mg by mouth daily.     gabapentin (NEURONTIN) 400 MG capsule Take 400 mg by mouth 2 (two) times daily.     lisinopril  (ZESTRIL ) 5 MG tablet Take 1 tablet (5 mg total) by mouth daily. 90 tablet 3   montelukast (SINGULAIR) 10 MG tablet Take 10 mg by mouth daily.     naproxen (NAPROSYN) 500 MG tablet Take 500 mg by mouth 2 (two) times daily as needed for pain.     Nutritional Supplements (KETO PO) Take 1 tablet by mouth daily. Unknown strength     omeprazole (PRILOSEC) 40 MG capsule Take 40 mg by mouth daily.     tamsulosin (FLOMAX) 0.4 MG CAPS capsule Take 0.4 mg by mouth daily.     traZODone (DESYREL) 100 MG tablet Take 100 mg by mouth at bedtime.     amoxicillin (AMOXIL)  500 MG capsule Take 2,000 mg by mouth once. 1 hour prior to dental appointment (Patient not taking: Reported on 01/19/2024)     No current facility-administered medications for this visit.    Allergies Allergies  Allergen Reactions   Sulfamethoxazole-Trimethoprim Rash   Cephalosporins Hives   Oxycodone-Acetaminophen     Other reaction(s): Other (See Comments) Percocet-sleep walks    Tape Rash    Can use paper tape    Histories Past Medical History:  Diagnosis Date   Arthritis    DOE  (dyspnea on exertion)    Essential hypertension    GERD (gastroesophageal reflux disease)    Headache    IBS (irritable bowel syndrome)    Kidney stone    Mild obstructive sleep apnea    NPH (normal pressure hydrocephalus) (HCC) 02/12/2018   Seasonal allergies    Past Surgical History:  Procedure Laterality Date   HAND SURGERY     NECK SURGERY     SHOULDER SURGERY Bilateral    VENTRICULOPERITONEAL SHUNT  02/13/2018   By Reeves [facility] at [facility]   Social History   Socioeconomic History   Marital status: Married    Spouse name: Not on file   Number of children: 0   Years of education: Not on file   Highest education level: Not on file  Occupational History   Occupation: retired  Tobacco Use   Smoking status: Never   Smokeless tobacco: Never  Vaping Use   Vaping status: Never Used  Substance and Sexual Activity   Alcohol use: Never   Drug use: Never   Sexual activity: Yes  Other Topics Concern   Not on file  Social History Narrative   Not on file   Social Drivers of Health   Financial Resource Strain: Not on file  Food Insecurity: Not on file  Transportation Needs: Not on file  Physical Activity: Not on file  Stress: Not on file  Social Connections: Not on file  Intimate Partner Violence: Not on file   Family History  Problem Relation Age of Onset   Hypertension Mother    Lung cancer Father    Esophageal cancer Father    Liver disease Neg Hx    Colon cancer Neg Hx    Inflammatory bowel disease Neg Hx    Pancreatic cancer Neg Hx    Rectal cancer Neg Hx    Stomach cancer Neg Hx    I have reviewed his medical, social, and family history in detail and updated the electronic medical record as necessary.    PHYSICAL EXAMINATION  BP 110/70 (BP Location: Left Arm, Patient Position: Sitting, Cuff Size: Large)   Pulse 76   Ht 5' 5.5 (1.664 m) Comment: height measured without shoes  Wt 199 lb (90.3 kg)   BMI 32.61 kg/m  Wt Readings  from Last 3 Encounters:  01/19/24 199 lb (90.3 kg)  11/10/23 195 lb (88.5 kg)  08/11/23 203 lb (92.1 kg)  GEN: NAD, appears stated age, doesn't appear chronically ill, accompanied by wife PSYCH: Cooperative, without pressured speech EYE: Conjunctivae pink, sclerae anicteric ENT: MMM CV: Nontachycardic RESP: No audible wheezing GI: NABS, soft, protuberant abdomen, rounded, nontender, without rebound MSK/EXT: No significant lower extremity edema SKIN: No jaundice NEURO:  Alert & Oriented x 3, no focal deficits   REVIEW OF DATA  I reviewed the following data at the time of this encounter:  GI Procedures and Studies  No new studies to review  Laboratory Studies  Reviewed those in epic  Imaging Studies  No relevant studies to review   ASSESSMENT/PLAN  Mr. Straw is a 73 y.o. male.  The patient is seen today for evaluation and management of:  1. Change in bowel habits   2. Bloating   3. Abdominal gas pain   4. Hx of adenomatous colonic polyps    The patient is clinically and hemodynamically stable at this time.   Altered bowel habits with gas and bloating Improvement in bowel habits with increased fiber intake.  Bowel movements occur every two to three days, with initial hard stools followed by runny stools. Persistent gas and bloating despite daily Gas-X. Consider SIBO as a potential cause. - Check Gas-X dosing and increase as necessary, potentially up to two to four times a day depending on strength. - Consider transitioning to Phazyme 500 for once daily high-strength dosing. - Perform SIBO breath test to evaluate for bacterial overgrowth. - Review breath test results to determine need for Xifaxin.  Surveillance for multiple adenomatous colon polyps - Schedule repeat colonoscopy in 2026 for 1-year follow-up    No orders of the defined types were placed in this encounter.   New Prescriptions   No medications on file   Modified Medications   No medications on  file    Planned Follow Up Return in about 4 months (around 05/21/2024).   Total Time in Face-to-Face and in Coordination of Care for patient including independent/personal interpretation/review of prior testing, medical history, examination, medication adjustment, communicating results with the patient directly, and documentation within the EHR is 30 minutes.   Aloha Finner, MD [facility] Office # [phone]

## 2024-01-25 DIAGNOSIS — Z131 Encounter for screening for diabetes mellitus: Secondary | ICD-10-CM | POA: Diagnosis not present

## 2024-01-25 DIAGNOSIS — F419 Anxiety disorder, unspecified: Secondary | ICD-10-CM | POA: Diagnosis not present

## 2024-01-25 DIAGNOSIS — Z23 Encounter for immunization: Secondary | ICD-10-CM | POA: Diagnosis not present

## 2024-01-25 DIAGNOSIS — Z0000 Encounter for general adult medical examination without abnormal findings: Secondary | ICD-10-CM | POA: Diagnosis not present

## 2024-01-25 DIAGNOSIS — Z125 Encounter for screening for malignant neoplasm of prostate: Secondary | ICD-10-CM | POA: Diagnosis not present

## 2024-01-25 DIAGNOSIS — E785 Hyperlipidemia, unspecified: Secondary | ICD-10-CM | POA: Diagnosis not present

## 2024-01-25 DIAGNOSIS — K219 Gastro-esophageal reflux disease without esophagitis: Secondary | ICD-10-CM | POA: Diagnosis not present

## 2024-01-25 DIAGNOSIS — Z1331 Encounter for screening for depression: Secondary | ICD-10-CM | POA: Diagnosis not present

## 2024-02-10 DIAGNOSIS — R0981 Nasal congestion: Secondary | ICD-10-CM | POA: Diagnosis not present

## 2024-02-10 DIAGNOSIS — R051 Acute cough: Secondary | ICD-10-CM | POA: Diagnosis not present

## 2024-02-12 DIAGNOSIS — R051 Acute cough: Secondary | ICD-10-CM | POA: Diagnosis not present

## 2024-02-12 DIAGNOSIS — R0982 Postnasal drip: Secondary | ICD-10-CM | POA: Diagnosis not present

## 2024-02-12 DIAGNOSIS — R0981 Nasal congestion: Secondary | ICD-10-CM | POA: Diagnosis not present

## 2024-03-08 ENCOUNTER — Telehealth: Payer: Self-pay | Admitting: Gastroenterology

## 2024-03-08 NOTE — Telephone Encounter (Signed)
 Returned call to patient. Advised that I would send copy of instructions to him. He states that he is not good with computers and it would be easier to have paper copy. Instructions mailed to patient. Patient voiced understanding.

## 2024-03-08 NOTE — Telephone Encounter (Signed)
 Inbound call from patient stating that he missed place a paper that came with the SIBO breathe test from [facility]. Patient is requesting a call back from the nurse. Please advise.
